# Patient Record
Sex: Male | Born: 1965 | Race: White | Hispanic: No | Marital: Married | Smoking: Former smoker
Health system: Southern US, Community
[De-identification: ages and names within clinical notes are randomized; demographics above are authoritative.]

## PROBLEM LIST (undated history)

## (undated) DIAGNOSIS — C801 Malignant (primary) neoplasm, unspecified: Secondary | ICD-10-CM

## (undated) HISTORY — PX: UPPER GASTROINTESTINAL ENDOSCOPY: SHX188

## (undated) HISTORY — DX: Malignant (primary) neoplasm, unspecified: C80.1

---

## 2005-12-16 ENCOUNTER — Ambulatory Visit: Payer: Self-pay | Admitting: Internal Medicine

## 2006-01-07 ENCOUNTER — Ambulatory Visit (HOSPITAL_COMMUNITY): Admission: RE | Admit: 2006-01-07 | Discharge: 2006-01-07 | Payer: Self-pay | Admitting: Internal Medicine

## 2006-01-07 ENCOUNTER — Ambulatory Visit: Payer: Self-pay | Admitting: Internal Medicine

## 2006-01-07 ENCOUNTER — Encounter (INDEPENDENT_AMBULATORY_CARE_PROVIDER_SITE_OTHER): Payer: Self-pay | Admitting: *Deleted

## 2006-04-07 ENCOUNTER — Ambulatory Visit: Payer: Self-pay | Admitting: Internal Medicine

## 2006-04-10 ENCOUNTER — Ambulatory Visit: Payer: Self-pay | Admitting: Internal Medicine

## 2006-04-21 ENCOUNTER — Ambulatory Visit: Payer: Self-pay | Admitting: Internal Medicine

## 2006-04-21 ENCOUNTER — Ambulatory Visit (HOSPITAL_COMMUNITY): Admission: RE | Admit: 2006-04-21 | Discharge: 2006-04-21 | Payer: Self-pay | Admitting: Internal Medicine

## 2006-04-21 ENCOUNTER — Encounter (INDEPENDENT_AMBULATORY_CARE_PROVIDER_SITE_OTHER): Payer: Self-pay | Admitting: Specialist

## 2008-05-03 ENCOUNTER — Emergency Department (HOSPITAL_COMMUNITY): Admission: EM | Admit: 2008-05-03 | Discharge: 2008-05-03 | Payer: Self-pay | Admitting: Emergency Medicine

## 2009-03-19 IMAGING — CT CT PELVIS W/O CM
1 of 2 series · 15 of 32 positions shown, 19 images · non-contrast
Comparison: None

CT ABDOMEN

CLINICAL DATA: Right flank pain, nausea, vomiting

CT ABDOMEN AND PELVIS WITHOUT CONTRAST
TECHNIQUE: Multidetector CT imaging of the abdomen and pelvis was
performed following the standard protocol without intravenous
contrast. Sagittal and coronal MPR images reconstructed from axial
data set.

[Series 2: stone 5.0 b40f · axial · 0.65mm/px · z∈[-514,-84]mm · 15 of 94 slices shown, 19 images]
[im 4/94  soft-tissue]
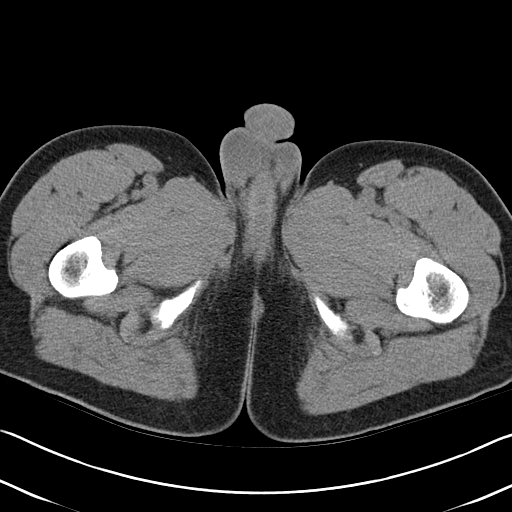
[im 4/94  bone]
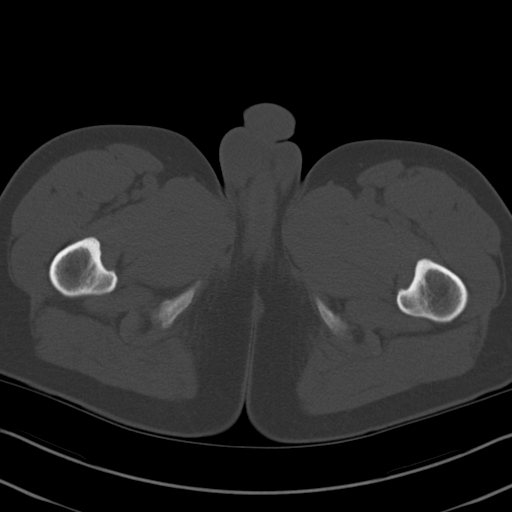
[im 12/94  soft-tissue]
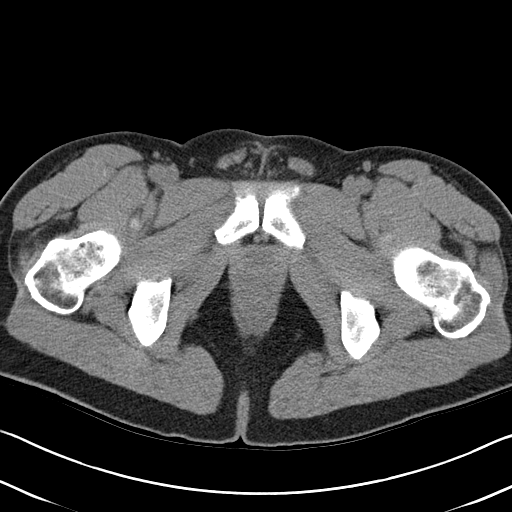
[im 19/94  soft-tissue]
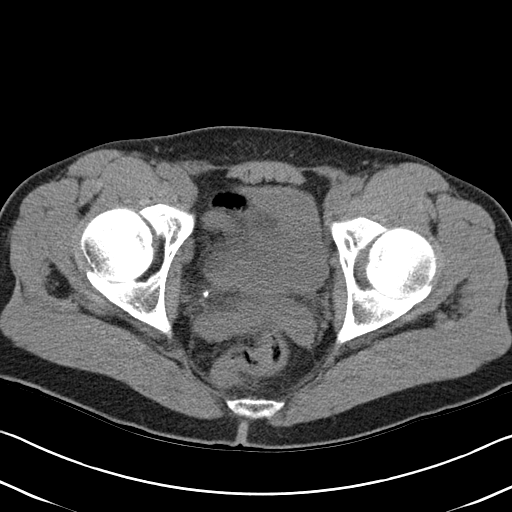
[im 27/94  soft-tissue]
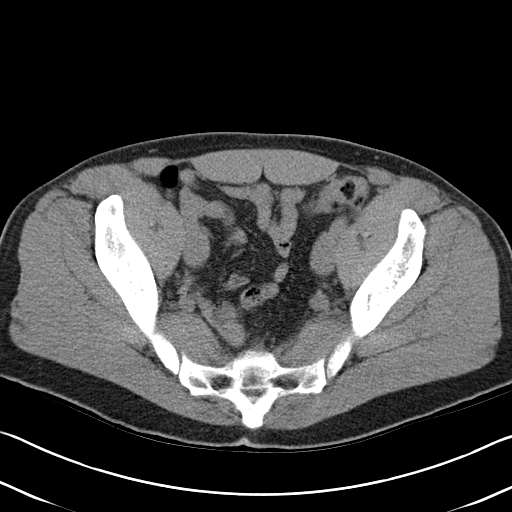
[im 34/94  soft-tissue]
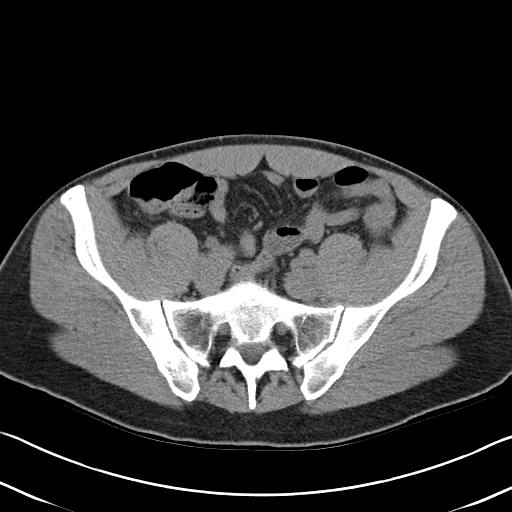
[im 41/94  soft-tissue]
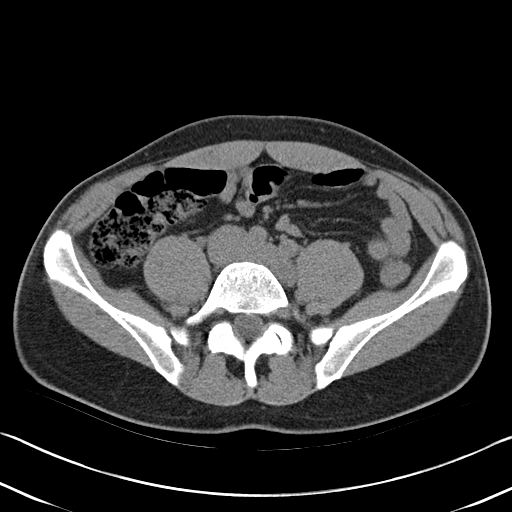
[im 49/94  soft-tissue]
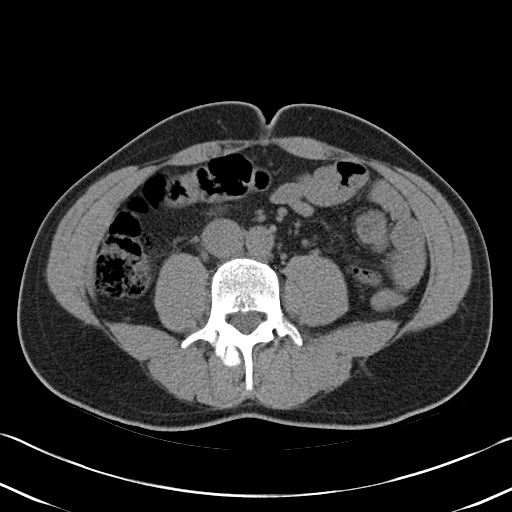
[im 53/94  soft-tissue]
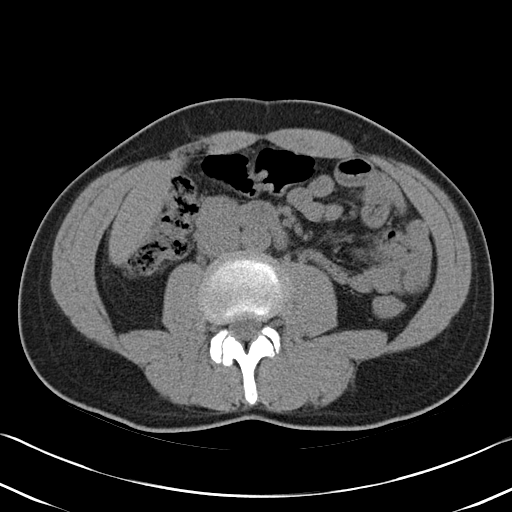
[im 60/94  soft-tissue]
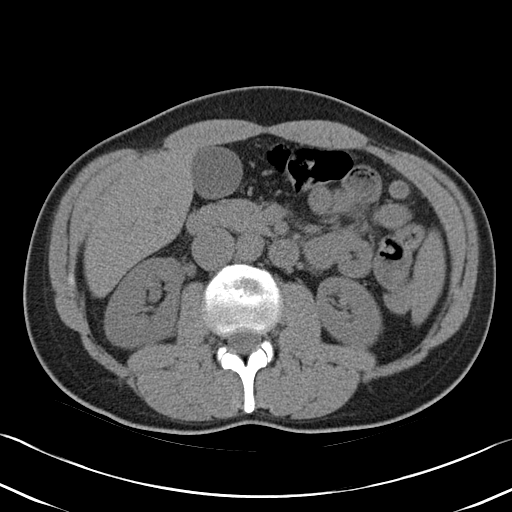
[im 60/94  bone]
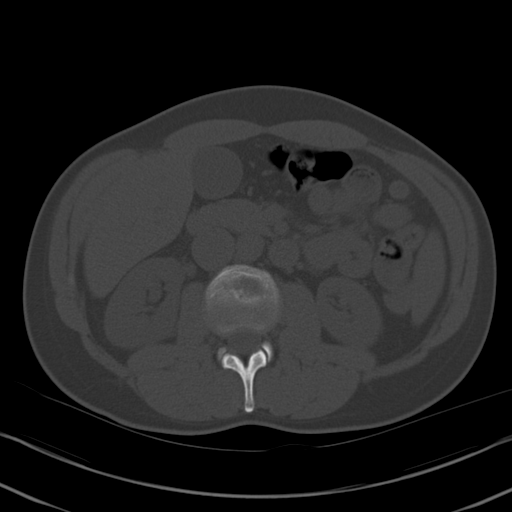
[im 67/94  soft-tissue]
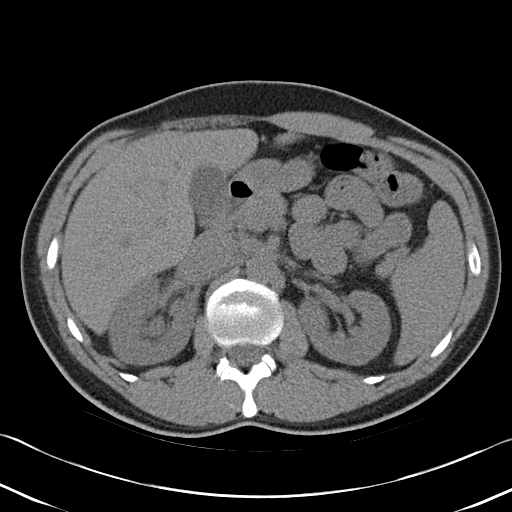
[im 75/94  soft-tissue]
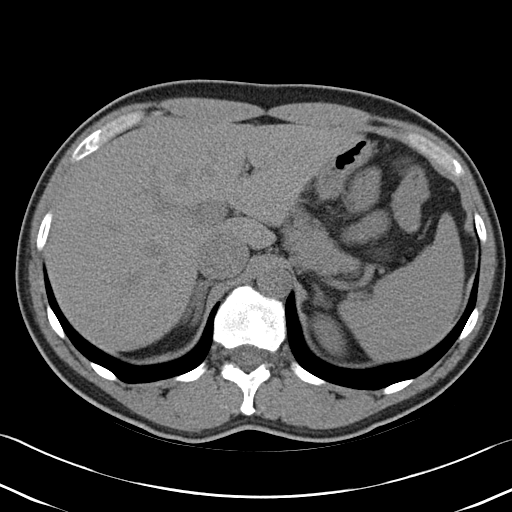
[im 79/94  lung]
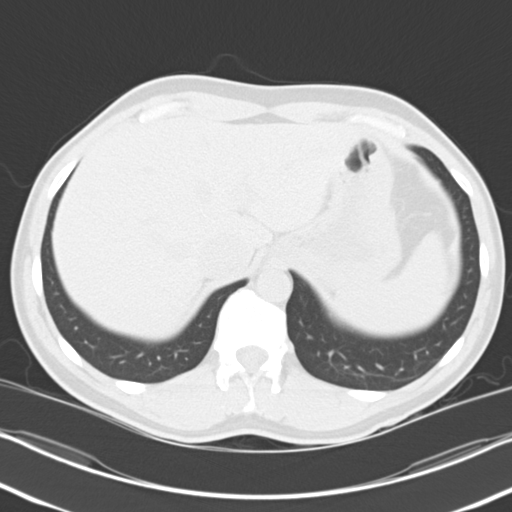
[im 82/94  soft-tissue]
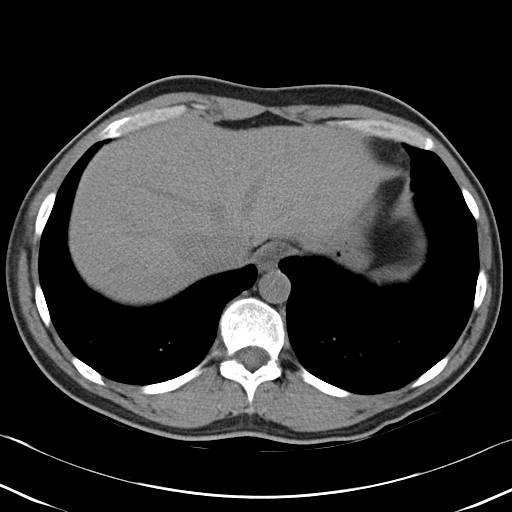
[im 82/94  lung]
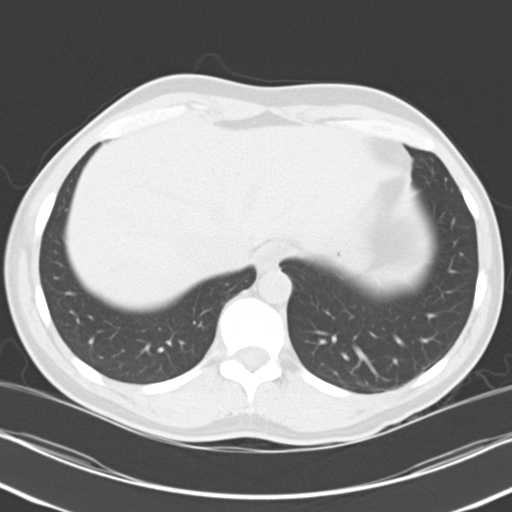
[im 86/94  lung]
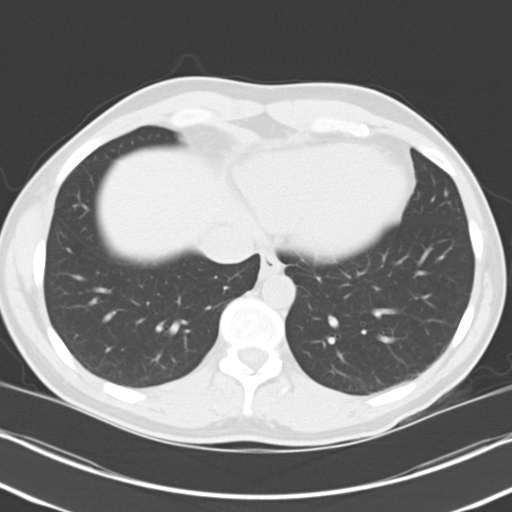
[im 90/94  soft-tissue]
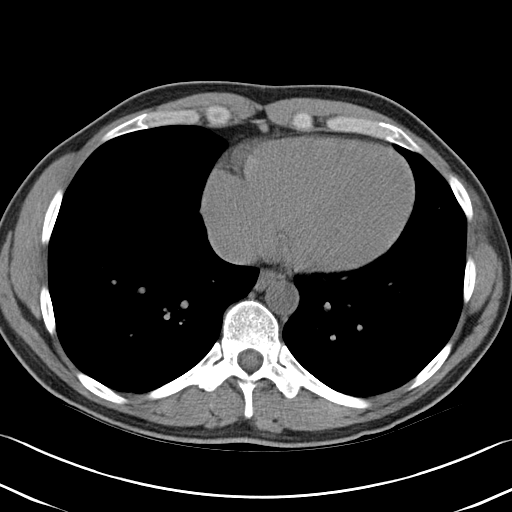
[im 90/94  lung]
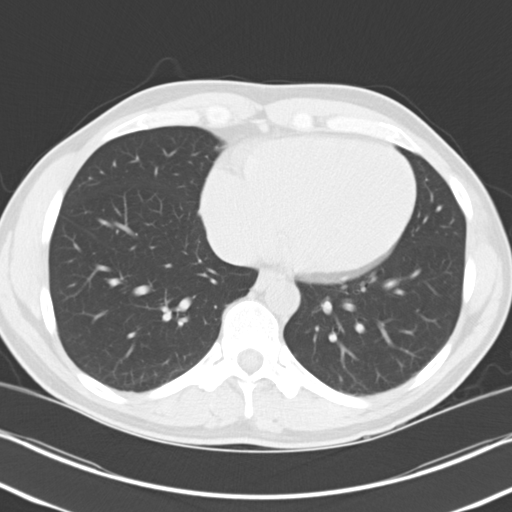

[15 of 32 positions shown; findings below may reference images not displayed]

FINDINGS: Lung bases clear.
Right hydronephrosis with dilatation of right ureter extending into
pelvis.
No calculi seen within either kidney.
Remaining solid organs and bowel loops in upper abdomen
unremarkable for exam lacking IV and oral contrast.
No mass, adenopathy, or free fluid.
IMPRESSION: Right hydronephrosis and hydroureter, see below.

CT PELVIS
FINDINGS: Right ureteral dilatation terminates at a 3 mm diameter calculus
image 76, just above ureterovesicle junction.
Minimal prostatic enlargement.
Bladder and distal left ureter normal appearance.
No pelvic mass, adenopathy, free fluid, or inflammatory process.
Pelvic bowel loops unremarkable.
Scattered Schmorl's nodes thoracolumbar spine with tiny bone island
in left sacrum.
IMPRESSION: 3 mm diameter distal right ureteral calculus above ureterovesicle
junction, causing right hydronephrosis and hydroureter.

## 2010-11-30 NOTE — H&P (Signed)
Joe Proctor, Joe Proctor                 ACCOUNT NO.:  [id number]   MEDICAL RECORD NO.:  [id number]           PATIENT TYPE:   LOCATION:                                 FACILITY:   PHYSICIAN:  R. Roetta Sessions, M.D. DATE OF BIRTH:  [birth year]   DATE OF ADMISSION:  DATE OF DISCHARGE:  LH                                HISTORY & PHYSICAL   REASON FOR CONSULTATION:  Iron deficiency.   HISTORY OF PRESENT ILLNESS:  Joe Proctor is a pleasant, 45 year old  Caucasian male employed with [employer] referred through the courtesy  of Dr. Lilyan Proctor to further evaluate a low ferritin and iron saturation.  Apparently earlier this year, he had some health maintenance labs done  through his employer and he was found to be mildly anemic on at least two  occasions. This was followed up with lab work through Dr. Fletcher Anon office.  Back on October 01, 2005, his H&H was normal at 13.3 and 40.8, MCV 81.4. Serum  iron was relatively low at 51. Ferritin was just below the lower limits of  normal at 20. Total iron binding capacity 361, saturation 15% with a serum  iron of 54 subsequently. His ferritin on October 14, 2005 was 30. He has been  Hemoccult negative x3 through Dr. Fletcher Anon office. He has not had any melena  or hematochezia. He has one bowel movement daily, no upper GI tract symptoms  such as odynophagia, dysphagia, or __________ reflux symptoms, nausea or  vomiting. No abdominal pain, no change in weight. He has no taken any  nonsteroidal's. Family history significant that one maternal uncle had colon  cancer and  he believes his father has had colonic polyps. Joe Proctor does  not eat red meat or green leafy vegetables very often. His diet is largely  chicken based.   PAST MEDICAL HISTORY:  Otherwise unremarkable for chronic illnesses.   PAST SURGICAL HISTORY:  None.   CURRENT MEDICATIONS:  None.   ALLERGIES:  No known drug allergies.   FAMILY HISTORY:  As outlined above.   SOCIAL  HISTORY:  The patient is married and has one child. He is employed  with the [employer] with [employer]. He stopped smoking at  age 44. No alcohol or illicit drugs. He is very active and jogs and bikes  for exercise.   REVIEW OF SYSTEMS:  No chest pain, no dyspnea on exertion. No fever or  chills otherwise as in history of present illness.   PHYSICAL EXAMINATION:  GENERAL:  Reveals a robust appearing 45 year old  gentleman resting comfortably.  VITAL SIGNS:  Weight 161, height 5 foot 11, temp 97.9, BP 118/80, pulse 60.  SKIN:  Warm and dry, no jaundice. No continuous stigmata of chronic liver  disease.  HEENT:  No scleral icterus, JVD is not prominent.  CHEST:  Lungs are clear to auscultation.  CARDIAC:  Regular rate and rhythm without murmur, gallop or rub.  ABDOMEN:  Nondistended, positive bowel sounds, soft, nontender, without  appreciable mass or organomegaly.  EXTREMITIES:  No edema.  RECTAL:  No external lesions. Good sphincter tones. No mass in the rectal  vault. Scant brown stools Hemoccult negative.   IMPRESSION:  Joe Proctor is a pleasant 45 year old gentleman with lab  work suggesting a tendency towards iron deficiency although he is not anemic  on more recent lab work.   Relative iron deficiency may be related to the lack of dietary intake of  iron. We certainly need to be concerned about the possibility of an early  lesion in his colon producing iron deficiency via a slow GI bleed although  the bleeding has not been documented as of this time. Also the possibility  of silent celiac disease needs to be considered as well.   RECOMMENDATIONS:  I recommended he go ahead and have a colonoscopy in the  couple of weeks. In the interim will draw a celiac panel to screen him for  celiac disease. The potential risks, benefits, and alternatives of this  approach have been reviewed, questions answered, he is agreeable. Will make  further recommendations in  the near future.   I would like to thank Dr. Lilyan Proctor for allowing me to see this nice  gentleman today.      Joe Proctor, M.D.  Electronically Signed     RMR/MEDQ  D:  12/16/2005  T:  12/16/2005  Job:  [id number]   cc:   Joe Picket A. Gerda Diss, MD  Fax: [phone]

## 2010-11-30 NOTE — Op Note (Signed)
Joe Proctor, Joe Proctor                 ACCOUNT NO.:  [id number]   MEDICAL RECORD NO.:  [id number]          PATIENT TYPE:  AMB   LOCATION:  DAY                           FACILITY:  [facility]   PHYSICIAN:  R. Roetta Sessions, M.D. DATE OF BIRTH:  1965-10-04   DATE OF PROCEDURE:  04/21/2006  DATE OF DISCHARGE:                                 OPERATIVE REPORT   PROCEDURE:  EGD with Given small bowel capsule deployment, followed by  subsequent upstream small bowel biopsy.   INDICATIONS FOR PROCEDURE:  The patient is a 45 year old gentleman with  profound iron deficiency anemia.  He recently underwent a colonoscopy which  was unrevealing. Celiac antibody panels negative.  He is undergoing EGD now,  possible survey of small bowel depending on EGD findings.  This approach has  been discussed with the patient previously. Potential risks, benefits, and  alternatives have reviewed, questions answered, and he is agreeable.  Please  see documentation in the medical record.   PROCEDURE NOTE:  O2 saturation, blood pressure, and pulse oximetry were  monitor the entire procedure.  Conscious sedation as Versed 4 mg IV and  Demerol 100 mg IV in divided doses   INSTRUMENT:  Olympus video chip system.   FINDINGS:  Examination of the tubular esophagus revealed no mucosal  abnormalities.  The EG junction was easily traversed.   Stomach:  The gastric cavity was empties and insufflated well with air.  Thorough examination of the gastric mucosa on retroflexion revealed the  proximal stomach and esophagogastric junction.  It demonstrated only a tiny  hiatal hernia.  Pylorus was patent and easily traversed.  Examination of the  bulb, second, and third portion revealed no abnormalities.   Therapeutic/diagnostic maneuvers performed:  I removed the scope, attached a  Given capsule deployment device, reintubated the esophagus with the capsule  loaded onto the device through the scope. It was easily advanced down to the  duodenum, where the Given capsule was deployed well into the second portion  of the duodenum.  Subsequently, I reintroduced scope and took some biopsies  of the second portion of duodenum, well above or upstream of the capsule.  The patient tolerated the procedure well and was reactivated.   ENDOSCOPIC IMPRESSION:  Normal esophagus.  Small tiny hiatal hernia,  otherwise normal stomach, D1-D3, status post small bowel capsule deployment  into the duodenum endoscopically, status post biopsies of the D2 and D3  upstream of capsule.   RECOMMENDATIONS:  To follow up on Given today and path.  Further  recommendations to follow.      Joe Proctor, M.D.  Electronically Signed     RMR/MEDQ  D:  04/21/2006  T:  04/21/2006  Job:  [id number]   cc:   Lorin Picket A. Gerda Diss, MD  Fax: [phone]

## 2010-11-30 NOTE — Op Note (Signed)
[name], Joe                 ACCOUNT NO.:  [id number]   MEDICAL RECORD NO.:  [id number]          PATIENT TYPE:  AMB   LOCATION:  DAY                           FACILITY:  [facility]   PHYSICIAN:  R. Roetta Sessions, M.D. DATE OF BIRTH:  24-Jun-1966   DATE OF PROCEDURE:  01/07/2006  DATE OF DISCHARGE:                                 OPERATIVE REPORT   PROCEDURE PERFORMED:  Colonoscopy with ileoscopy and biopsy.   INDICATIONS FOR PROCEDURE:  The patient is a 45 year old Caucasian male who  was found to have iron deficiency.  He is devoid of any GI tract symptoms.  Colonoscopy is now being done.  This approach has been discussed with the  patient at length.  Potential risks, benefits and alternatives have been  reviewed, questions have been answered, he is agreeable.  Please see  documentation in the medical records.  He does not have any first degree  relatives with colon cancer.  He does have a maternal uncle with the  disease.  His father may have had colonic polyps.  He is essentially a  vegetarian, but does not have an aversion to red meat, green leafy  vegetables otherwise.   PROCEDURE NOTE:  Oxygen saturations, blood pressure, pulse and respirations  were monitored throughout the entire procedure.   CONSCIOUS SEDATION:  Versed 5 mg IV, Demerol 75 mg IV in divided doses.   INSTRUMENT USED:  Olympus video chip system.   FINDINGS:  Digital rectal exam revealed no abnormalities.   ENDOSCOPIC FINDINGS:  Prep was adequate.   Rectum:  Examination of the rectal mucosa including retroflex view of the  anal verge revealed two diminutive polyps at 15 cm in from the anal verve,  minimal internal hemorrhoids, couple of anal papilla.  Otherwise rectal  mucosa appeared normal.  Colon:  Colonic mucosa was surveyed from the rectosigmoid junction to the  left, transverse and right colon to the area of the appendiceal orifice and  ileocecal valve and cecum.  These structures were well seen and  photographed  for the record.  The terminal ileum was intubated to 10 cm.  From this  level, the scope was cautiously, slowly withdrawn.  All previously mentioned  mucosal surfaces were again seen.  The patient had a few scattered sigmoid  diverticula.  The remainder of the colonic mucosa and terminal ileal mucosa  appeared normal.  The diminutive polyps in the rectum were cold  biopsied/removed.  The patient tolerated the procedure well, was reacted in  endoscopy.   IMPRESSION:  Minimal anal papilla.  Internal hemorrhoids.  Diminutive rectal  polyps, cold biopsied removed.  Remainder of rectal mucosa appeared normal.  Sigmoid diverticula.  Remainder of colonic mucosa and terminal ileal mucosa  appeared normal.   Findings were reassuring.  Iron deficiency may be related to the lack of  dietary iron intake.   RECOMMENDATIONS:  Continue multivitamin now with iron.  At least two  servings of red meat weekly with liberalization of green leafy vegetable  intake.  Follow-up on pathology.  Diverticulosis literature provided to [name].  We will see him back in eight weeks and recheck his CBC and iron  studies.      [name], M.D.  Electronically Signed     RMR/MEDQ  D:  01/07/2006  T:  01/07/2006  Job:  [id number]   cc:   Donna Bernard, M.D.  Fax: [phone]

## 2010-11-30 NOTE — Op Note (Signed)
Joe Proctor, Joe Proctor                 ACCOUNT NO.:  [id number]   MEDICAL RECORD NO.:  [id number]          PATIENT TYPE:  AMB   LOCATION:  DAY                           FACILITY:  [facility]   PHYSICIAN:  R. Roetta Sessions, M.D. DATE OF BIRTH:  Jul 13, 1966   DATE OF PROCEDURE:  04/21/2006  DATE OF DISCHARGE:  04/21/2006                                 OPERATIVE REPORT   INDICATIONS FOR PROCEDURE:  Patient is a 45 year old gentleman with relative  iron deficiency.  His hemoglobin is normal.  One out of three Hemoccult  cards came back positive for blood.  Clinically has not had any bleeding or  other GI symptoms.  Recent colonoscopy demonstrated a couple of small  hyperplastic polyps which were removed and some left-sided diverticula.  EGD  done 04/21/2006 demonstrated small hiatal hernia. otherwise upper GI tract  appeared normal.  The Given small bowel capsule was deployed into the small  bowel.   Images small bowel have been reviewed.  Small bowel mucosa appeared normal  although there was slight amount of blood-tinged mucus in the very proximal  duodenum (this is consistent with minimal bleeding from small bowel biopsy  which occurred just prior to capsule deployment).  The capsule reached the  cecum in 4 hours and 11 minutes.   These findings are reassuring.   We will review the small bowel histology when available, make further  recommendations in the very near future.      [name], M.D.  Electronically Signed     RMR/MEDQ  D:  04/22/2006  T:  04/23/2006  Job:  [id number]   cc:   Lorin Picket A. Gerda Diss, MD  Fax: [phone]

## 2010-11-30 NOTE — H&P (Signed)
[name], [name]                 ACCOUNT NO.:  [id number]   MEDICAL RECORD NO.:  [id number]          PATIENT TYPE:  AMB   LOCATION:  DAY                           FACILITY:  [facility]   PHYSICIAN:  R. Roetta Sessions, M.D. DATE OF BIRTH:  1966-06-04   DATE OF ADMISSION:  04/10/2006  DATE OF DISCHARGE:  LH                                HISTORY & PHYSICAL   CHIEF COMPLAINT:  Iron deficiency anemia, 1 out 3 Hemoccult cards positive,  a negative colonoscopy recently.  Joe Proctor is a pleasant 45 year old  Caucasian male essentially devoid of any GI symptoms who was found to be  iron deficient on a recent set of employee wellness labs.  He has been  followed by Dr. Lilyan Punt primarily.  His ferritin was found to be low at  20.  His hemoglobin and hematocrit have been in the low to normal range at  13.3 and 40.8 back in March of this year.  MCV was 84.1.  Iron studies  showed a serum iron of 54, iron sat of 15 with an IBC of 361.  Ferritin was  relatively low normal at 30.  I saw him for iron deficiency anemia back in  June.  He underwent a colonoscopy which revealed a minimal anal __________  internal hemorrhoids, diminutive rectal polyp which was removed and turned  out to be hyperplastic.  Otherwise, the rectum and colon and terminal ileum  appeared normal aside from sigmoid diverticula.  Celiac antibody screen came  back negative.  From April 01, 2006, his iron sat through our office was  low at 13, serum iron lower limit of normal at 46, TIBC 359, folate greater  than 20, ferritin 36.  He has returned 3 Hemoccult cards.  One has come back  positive.  Again, Joe Proctor has no GI symptoms.  No melena, rectal  bleeding, change in bowel habits, abdominal pain, or any upper GI symptoms  such as odynophagia, dysphagia, early satiety, reflux symptoms, nausea,  vomiting.  He is not taking any over-the-counter agents except for a  multivitamin with iron.  He has liberalized the intake of  red meats and  green leafy vegetables.  It is notable he has gained 7 pounds since he was  seen here in June.   PAST MEDICAL HISTORY:  Unremarkable for chronic illnesses.   PAST SURGICAL HISTORY:  None.   CURRENT MEDICATIONS:  Multivitamin with iron.   ALLERGIES:  No known drug allergies.   FAMILY HISTORY:  One maternal uncle had colon cancer.   SOCIAL HISTORY:  Patient is married.  He has 1 child.  He is employed with  [employer] with [employer].  He does not smoke and no  alcohol or illicit drugs.  He is very active; he jogs and bikes on a regular  basis.   REVIEW OF SYSTEMS:  As above.  No chest pain, dyspnea on exertion.  Weight  gain as outlined above.  No fever or chills.   PHYSICAL EXAMINATION:  GENERAL:  Reveals a pleasant 45 year old gentleman  resting comfortably.  VITAL SIGNS:  Weight 168, height 5 feet 11 inches, temp 97.1, BP 126/80,  pulse 56.  SKIN:  Warm and dry.  No jaundice.  No __________  stigmata of chronic liver  disease.  HEENT EXAM:  No scleral icterus.  JVD is not prominent.  CHEST:  Lungs are clear to auscultation.  CARDIAC EXAM:  Regular rate and rhythm without murmur, gallop, rub.   IMPRESSION:  Joe Proctor is a pleasant 45 year old gentleman who was  found to have relative iron deficiency state.  He is not anemic.  He has no  gastrointestinal symptoms.  Findings on recent colonoscopy reassuring, as  was a negative celiac antibody panel.  However, he is Hemoccult positive  which warrants further evaluation.  In the algorithm, I recommended [name] go ahead and have an EGD.  If that is unremarkable, we will go ahead  and deploy a Given's capsule to study his small bowel on the same day.  This  approach has been discussed with Joe Proctor at length; and its risks,  benefits, and alternatives have been reviewed.  Questions answered; he is  agreeable.  I will plan to perform the procedures in the near future.   Further  recommendations to follow.      [name], M.D.  Electronically Signed     RMR/MEDQ  D:  04/10/2006  T:  04/10/2006  Job:  [id number]   cc:   Lorin Picket A. Gerda Diss, MD  Fax: [phone]

## 2011-04-16 LAB — URINE MICROSCOPIC-ADD ON

## 2011-04-16 LAB — URINALYSIS, ROUTINE W REFLEX MICROSCOPIC
Ketones, ur: NEGATIVE
Leukocytes, UA: NEGATIVE
Nitrite: NEGATIVE
Specific Gravity, Urine: >1.03 — ABNORMAL HIGH
Urobilinogen, UA: 0.2

## 2013-02-04 DIAGNOSIS — Z0000 Encounter for general adult medical examination without abnormal findings: Secondary | ICD-10-CM

## 2013-05-31 ENCOUNTER — Telehealth: Payer: Self-pay | Admitting: Family Medicine

## 2013-05-31 DIAGNOSIS — R5381 Other malaise: Secondary | ICD-10-CM

## 2013-05-31 DIAGNOSIS — Z0000 Encounter for general adult medical examination without abnormal findings: Secondary | ICD-10-CM

## 2013-05-31 DIAGNOSIS — Z79899 Other long term (current) drug therapy: Secondary | ICD-10-CM

## 2013-05-31 DIAGNOSIS — R5383 Other fatigue: Secondary | ICD-10-CM

## 2013-05-31 NOTE — Telephone Encounter (Signed)
Lipid/liver/cbc/met 7

## 2013-05-31 NOTE — Telephone Encounter (Signed)
Blood work ordered in Epic. Patient was notified.  

## 2013-05-31 NOTE — Telephone Encounter (Signed)
Patient needs BW paperwork for physical °

## 2013-06-01 LAB — CBC WITH DIFFERENTIAL/PLATELET
Eosinophils Absolute: 0.2 10*3/uL (ref 0.0–0.7)
Eosinophils Relative: 3 % (ref 0–5)
HCT: 41.5 % (ref 39.0–52.0)
Hemoglobin: 13.9 g/dL (ref 13.0–17.0)
Lymphocytes Relative: 35 % (ref 12–46)
Lymphs Abs: 1.7 10*3/uL (ref 0.7–4.0)
MCH: 27 pg (ref 26.0–34.0)
MCV: 80.6 fL (ref 78.0–100.0)
Monocytes Absolute: 0.4 10*3/uL (ref 0.1–1.0)
Neutro Abs: 2.7 10*3/uL (ref 1.7–7.7)
Neutrophils Relative %: 55 % (ref 43–77)
Platelets: 195 10*3/uL (ref 150–400)
RBC: 5.15 MIL/uL (ref 4.22–5.81)
RDW: 13.4 % (ref 11.5–15.5)

## 2013-06-01 LAB — BASIC METABOLIC PANEL
BUN: 18 mg/dL (ref 6–23)
CO2: 31 mEq/L (ref 19–32)
Calcium: 9.6 mg/dL (ref 8.4–10.5)
Chloride: 104 mEq/L (ref 96–112)
Creat: 1.1 mg/dL (ref 0.50–1.35)
Glucose, Bld: 91 mg/dL (ref 70–99)
Potassium: 5.1 mEq/L (ref 3.5–5.3)
Sodium: 140 mEq/L (ref 135–145)

## 2013-06-01 LAB — HEPATIC FUNCTION PANEL
Albumin: 4.2 g/dL (ref 3.5–5.2)
Bilirubin, Direct: 0.2 mg/dL (ref 0.0–0.3)
Indirect Bilirubin: 0.5 mg/dL (ref 0.0–0.9)
Total Bilirubin: 0.7 mg/dL (ref 0.3–1.2)
Total Protein: 6.7 g/dL (ref 6.0–8.3)

## 2013-06-01 LAB — LIPID PANEL
Cholesterol: 136 mg/dL (ref 0–200)
HDL: 44 mg/dL (ref >39)
LDL Cholesterol: 71 mg/dL (ref 0–99)
Triglycerides: 107 mg/dL (ref <150)

## 2013-06-02 ENCOUNTER — Encounter: Payer: Self-pay | Admitting: Family Medicine

## 2013-06-12 ENCOUNTER — Encounter: Payer: Self-pay | Admitting: *Deleted

## 2013-06-14 ENCOUNTER — Ambulatory Visit (INDEPENDENT_AMBULATORY_CARE_PROVIDER_SITE_OTHER): Payer: 59 | Admitting: Family Medicine

## 2013-06-14 ENCOUNTER — Encounter: Payer: Self-pay | Admitting: Family Medicine

## 2013-06-14 VITALS — BP 120/70 | Ht 73.0 in | Wt 179.2 lb

## 2013-06-14 DIAGNOSIS — Z0000 Encounter for general adult medical examination without abnormal findings: Secondary | ICD-10-CM

## 2013-06-14 DIAGNOSIS — Z23 Encounter for immunization: Secondary | ICD-10-CM

## 2013-06-14 NOTE — Progress Notes (Signed)
   Subjective:    Patient ID: Joe Proctor, male    DOB: 1966-06-02, 47 y.o.   MRN: [id number]  HPI Patient arrives for an annual physical. Patient brought results of blood work and stress test that was run at work for discussion. The patient comes in today for a wellness visit.  A review of their health history was completed.  A review of medications was also completed. Any necessary refills were discussed. Sensible healthy diet was discussed. Importance of minimizing excessive salt and carbohydrates was also discussed. Safety was stressed including driving, activities at work and at home where applicable. Importance of regular physical activity for overall health was discussed. Preventative measures appropriate for age were discussed. Time was spent with the patient discussing any concerns they have about their well-being. Past medical history family history noncontributory no family history of any cancers.  Review of Systems  Constitutional: Negative for fever, activity change and appetite change.  HENT: Negative for congestion and rhinorrhea.   Eyes: Negative for discharge.  Respiratory: Negative for cough and wheezing.   Cardiovascular: Negative for chest pain.  Gastrointestinal: Negative for vomiting, abdominal pain and blood in stool.  Genitourinary: Negative for frequency and difficulty urinating.  Musculoskeletal: Negative for neck pain.  Skin: Negative for rash.  Allergic/Immunologic: Negative for environmental allergies and food allergies.  Neurological: Negative for weakness and headaches.  Psychiatric/Behavioral: Negative for agitation.       Objective:   Physical Exam  Constitutional: He appears well-developed and well-nourished.  HENT:  Head: Normocephalic and atraumatic.  Right Ear: External ear normal.  Left Ear: External ear normal.  Nose: Nose normal.  Mouth/Throat: Oropharynx is clear and moist.  Eyes: EOM are normal. Pupils are equal, round, and  reactive to light.  Neck: Normal range of motion. Neck supple. No thyromegaly present.  Cardiovascular: Normal rate, regular rhythm and normal heart sounds.   No murmur heard. Pulmonary/Chest: Effort normal and breath sounds normal. No respiratory distress. He has no wheezes.  Abdominal: Soft. Bowel sounds are normal. He exhibits no distension and no mass. There is no tenderness.  Genitourinary: Penis normal.  Musculoskeletal: Normal range of motion. He exhibits no edema.  Lymphadenopathy:    He has no cervical adenopathy.  Neurological: He is alert. He exhibits normal muscle tone.  Skin: Skin is warm and dry. No erythema.  Psychiatric: He has a normal mood and affect. His behavior is normal. Judgment normal.          Assessment & Plan:  #1 wellness- overall he's doing good watch diet stay physically active. Safety was discussed. Lab work was reviewed looks very good. Followup again in one years time. He will need colonoscopy at age 37 prostate exam and PSA at age 22.

## 2014-05-24 ENCOUNTER — Other Ambulatory Visit: Payer: Self-pay | Admitting: Dermatology

## 2014-10-19 ENCOUNTER — Ambulatory Visit (INDEPENDENT_AMBULATORY_CARE_PROVIDER_SITE_OTHER): Payer: 59 | Admitting: Family Medicine

## 2014-10-19 ENCOUNTER — Encounter: Payer: Self-pay | Admitting: Family Medicine

## 2014-10-19 VITALS — BP 122/74 | Ht 73.0 in | Wt 175.0 lb

## 2014-10-19 DIAGNOSIS — B029 Zoster without complications: Secondary | ICD-10-CM | POA: Diagnosis not present

## 2014-10-19 MED ORDER — VALACYCLOVIR HCL 1 G PO TABS: 1000.0000 mg | ORAL_TABLET | Freq: Three times a day (TID) | ORAL | Status: DC

## 2014-10-19 NOTE — Progress Notes (Signed)
   Subjective:    Patient ID: Joe Proctor, male    DOB: [birth year], 49 y.o.   MRN: [id number]  Rash This is a new problem. Episode onset: 7 days ago. Pain location: right side. Treatments tried: benadryl cream.   Patient not sure if he had chickenpox as a child.  Also has a history of thoracic spinal pain and neuropathy which proceeded this   Not partic painful but sharp aching at times.   Alert vitals stable no acute distress lungs clear. Heart regular in rhythm. Started off as a rash , some discomfort skin classic cluster rash noted.  Review of Systems  Skin: Positive for rash.       Objective:   Physical Exam  See above      Assessment & Plan:  Impression shingles discussed at length plan Valtrex 3 times a day 7 days. Symptom care discussed questions answered WSL

## 2015-09-14 ENCOUNTER — Encounter: Payer: Self-pay | Admitting: Family Medicine

## 2015-09-14 ENCOUNTER — Ambulatory Visit (INDEPENDENT_AMBULATORY_CARE_PROVIDER_SITE_OTHER): Payer: 59 | Admitting: Family Medicine

## 2015-09-14 VITALS — BP 120/80 | Temp 98.4°F | Ht 73.0 in | Wt 186.5 lb

## 2015-09-14 DIAGNOSIS — J310 Chronic rhinitis: Secondary | ICD-10-CM

## 2015-09-14 DIAGNOSIS — J329 Chronic sinusitis, unspecified: Secondary | ICD-10-CM | POA: Diagnosis not present

## 2015-09-14 DIAGNOSIS — J209 Acute bronchitis, unspecified: Secondary | ICD-10-CM | POA: Diagnosis not present

## 2015-09-14 MED ORDER — AZITHROMYCIN 250 MG PO TABS: ORAL_TABLET | ORAL | Status: DC

## 2015-09-14 NOTE — Progress Notes (Signed)
   Subjective:    Patient ID: Joe Proctor, male    DOB: [birth year], 50 y.o.   MRN: [id number]  Cough This is a new problem. The current episode started in the past 7 days. The problem occurs every few minutes. The cough is non-productive. Associated symptoms include headaches and rhinorrhea. He has tried OTC cough suppressant (Mucinex DM) for the symptoms. The treatment provided mild relief.  cough deeper in chest  Some cong in the nasal passages     non smoker  Got a flu shot   Pos illness within the family  Non productive  Hoarse ful Patient states no other concerns this visit.  Review of Systems  HENT: Positive for rhinorrhea.   Respiratory: Positive for cough.   Neurological: Positive for headaches.       Objective:   Physical Exam  Alert, mild malaise. Hydration good Vitals stable. frontal/ maxillary tenderness evident positive nasal congestion. pharynx normal neck supple  lungs clear/no crackles or wheezes. heart regular in rhythm bronchial cough during exam       Assessment & Plan:  Impression post flu bronchitis/ rhinosinusitis likely post viral, discussed with patient. plan antibiotics prescribed. Questions answered. Symptomatic care discussed. warning signs discussed. WSL

## 2017-03-31 ENCOUNTER — Telehealth: Payer: Self-pay | Admitting: Family Medicine

## 2017-03-31 DIAGNOSIS — Z0000 Encounter for general adult medical examination without abnormal findings: Secondary | ICD-10-CM

## 2017-03-31 NOTE — Telephone Encounter (Signed)
Patient has physical next week with Dr. Nicki Reaper.  Would like orders put in to have bloodwork done at the hospital.  Please call patient when orders are in. Ok to leave a message.

## 2017-03-31 NOTE — Telephone Encounter (Signed)
Lipid, liver, metabolic 7, CBC, PSA, hepatitis C antibody for screening purposes

## 2017-03-31 NOTE — Telephone Encounter (Signed)
Spoke with patient and informed him per [place] have been ordered for upcoming appointment. Patient verbalized understanding.

## 2017-04-01 ENCOUNTER — Other Ambulatory Visit: Payer: Self-pay | Admitting: Family Medicine

## 2017-04-01 DIAGNOSIS — Z0000 Encounter for general adult medical examination without abnormal findings: Secondary | ICD-10-CM | POA: Diagnosis not present

## 2017-04-02 LAB — BASIC METABOLIC PANEL
BUN/Creatinine Ratio: 15 (ref 9–20)
BUN: 18 mg/dL (ref 6–24)
CO2: 26 mmol/L (ref 20–29)
CREATININE: 1.24 mg/dL (ref 0.76–1.27)
Calcium: 9.7 mg/dL (ref 8.7–10.2)
Chloride: 100 mmol/L (ref 96–106)
GFR calc Af Amer: 77 mL/min/{1.73_m2} (ref >59)
GFR, EST NON AFRICAN AMERICAN: 66 mL/min/{1.73_m2} (ref >59)
Glucose: 93 mg/dL (ref 65–99)
Potassium: 5.2 mmol/L (ref 3.5–5.2)
SODIUM: 140 mmol/L (ref 134–144)

## 2017-04-02 LAB — CBC/DIFF AMBIGUOUS DEFAULT
Basophils Absolute: 0 10*3/uL (ref 0.0–0.2)
Basos: 0 %
EOS (ABSOLUTE): 0.1 10*3/uL (ref 0.0–0.4)
EOS: 2 %
HEMATOCRIT: 42.6 % (ref 37.5–51.0)
HEMOGLOBIN: 14.4 g/dL (ref 13.0–17.7)
IMMATURE GRANS (ABS): 0 10*3/uL (ref 0.0–0.1)
Immature Granulocytes: 0 %
LYMPHS ABS: 1.8 10*3/uL (ref 0.7–3.1)
Lymphs: 35 %
MCH: 27.1 pg (ref 26.6–33.0)
MCHC: 33.8 g/dL (ref 31.5–35.7)
MCV: 80 fL (ref 79–97)
MONOCYTES: 7 %
Monocytes Absolute: 0.3 10*3/uL (ref 0.1–0.9)
NEUTROS ABS: 2.9 10*3/uL (ref 1.4–7.0)
Neutrophils: 56 %
Platelets: 189 10*3/uL (ref 150–379)
RBC: 5.31 x10E6/uL (ref 4.14–5.80)
RDW: 13.3 % (ref 12.3–15.4)
WBC: 5.1 10*3/uL (ref 3.4–10.8)

## 2017-04-02 LAB — HEPATIC FUNCTION PANEL
ALT: 19 IU/L (ref 0–44)
AST: 18 IU/L (ref 0–40)
Albumin: 4.8 g/dL (ref 3.5–5.5)
Alkaline Phosphatase: 93 IU/L (ref 39–117)
Bilirubin Total: 0.8 mg/dL (ref 0.0–1.2)
Bilirubin, Direct: 0.2 mg/dL (ref 0.00–0.40)
TOTAL PROTEIN: 7.6 g/dL (ref 6.0–8.5)

## 2017-04-02 LAB — AMBIG ABBREV LP DEFAULT

## 2017-04-02 LAB — LIPID PANEL W/O CHOL/HDL RATIO
Cholesterol, Total: 160 mg/dL (ref 100–199)
HDL: 50 mg/dL (ref >39)
LDL CALC: 91 mg/dL (ref 0–99)
TRIGLYCERIDES: 95 mg/dL (ref 0–149)
VLDL CHOLESTEROL CAL: 19 mg/dL (ref 5–40)

## 2017-04-02 LAB — AMBIG ABBREV HFP7 DEFAULT

## 2017-04-02 LAB — HEPATITIS C ANTIBODY

## 2017-04-02 LAB — PSA: PROSTATE SPECIFIC AG, SERUM: 0.9 ng/mL (ref 0.0–4.0)

## 2017-04-02 LAB — AMBIG ABBREV BMP8 DEFAULT

## 2017-04-07 ENCOUNTER — Encounter: Payer: Self-pay | Admitting: Family Medicine

## 2017-04-07 ENCOUNTER — Ambulatory Visit (INDEPENDENT_AMBULATORY_CARE_PROVIDER_SITE_OTHER): Payer: 59 | Admitting: Family Medicine

## 2017-04-07 VITALS — BP 110/70 | Ht 73.0 in | Wt 179.5 lb

## 2017-04-07 DIAGNOSIS — Z0000 Encounter for general adult medical examination without abnormal findings: Secondary | ICD-10-CM

## 2017-04-07 DIAGNOSIS — Z1211 Encounter for screening for malignant neoplasm of colon: Secondary | ICD-10-CM | POA: Diagnosis not present

## 2017-04-07 NOTE — Progress Notes (Signed)
   Subjective:    Patient ID: Joe Proctor, male    DOB: 1966-04-20, 51 y.o.   MRN: [id number]  HPI The patient comes in today for a wellness visit.    A review of their health history was completed.  A review of medications was also completed.  Any needed refills; None   Eating habits: Patient states eating habits are good. Tries to eat healthy.   Falls/  MVA accidents in past few months:  None   Regular exercise: Patient states tries to run 2x weekly and cross fit class 2x-3x per week.  Specialist pt sees on regular basis: None   Preventative health issues were discussed.  Diet doing good exercising on a regular basis is safe and what he does denies any injuries area states her moods overall doing good denies being depressed Additional concerns: Patient states no concerns this visit.    Review of Systems  Constitutional: Negative for activity change, appetite change and fever.  HENT: Negative for congestion and rhinorrhea.   Eyes: Negative for discharge.  Respiratory: Negative for cough and wheezing.   Cardiovascular: Negative for chest pain.  Gastrointestinal: Negative for abdominal pain, blood in stool and vomiting.  Genitourinary: Negative for difficulty urinating and frequency.  Musculoskeletal: Negative for neck pain.  Skin: Negative for rash.  Allergic/Immunologic: Negative for environmental allergies and food allergies.  Neurological: Negative for weakness and headaches.  Psychiatric/Behavioral: Negative for agitation.       Objective:   Physical Exam  Constitutional: He appears well-developed and well-nourished.  HENT:  Head: Normocephalic and atraumatic.  Right Ear: External ear normal.  Left Ear: External ear normal.  Nose: Nose normal.  Mouth/Throat: Oropharynx is clear and moist.  Eyes: Pupils are equal, round, and reactive to light. EOM are normal.  Neck: Normal range of motion. Neck supple. No thyromegaly present.  Cardiovascular: Normal rate,  regular rhythm and normal heart sounds.   No murmur heard. Pulmonary/Chest: Effort normal and breath sounds normal. No respiratory distress. He has no wheezes.  Abdominal: Soft. Bowel sounds are normal. He exhibits no distension and no mass. There is no tenderness.  Genitourinary: Penis normal.  Musculoskeletal: Normal range of motion. He exhibits no edema.  Lymphadenopathy:    He has no cervical adenopathy.  Neurological: He is alert. He exhibits normal muscle tone.  Skin: Skin is warm and dry. No erythema.  Psychiatric: He has a normal mood and affect. His behavior is normal. Judgment normal.          Assessment & Plan:  Adult wellness-complete.wellness physical was conducted today. Importance of diet and exercise were discussed in detail. In addition to this a discussion regarding safety was also covered. We also reviewed over immunizations and gave recommendations regarding current immunization needed for age. In addition to this additional areas were also touched on including: Preventative health exams needed: Colonoscopy Referral for colonoscopy  Patient was advised yearly wellness exam

## 2017-04-09 DIAGNOSIS — L821 Other seborrheic keratosis: Secondary | ICD-10-CM | POA: Diagnosis not present

## 2017-04-09 DIAGNOSIS — D229 Melanocytic nevi, unspecified: Secondary | ICD-10-CM | POA: Diagnosis not present

## 2018-05-20 ENCOUNTER — Other Ambulatory Visit: Payer: Self-pay | Admitting: Dermatology

## 2018-05-20 DIAGNOSIS — L570 Actinic keratosis: Secondary | ICD-10-CM | POA: Diagnosis not present

## 2018-05-20 DIAGNOSIS — L821 Other seborrheic keratosis: Secondary | ICD-10-CM | POA: Diagnosis not present

## 2018-05-20 DIAGNOSIS — C44319 Basal cell carcinoma of skin of other parts of face: Secondary | ICD-10-CM | POA: Diagnosis not present

## 2018-05-20 DIAGNOSIS — D229 Melanocytic nevi, unspecified: Secondary | ICD-10-CM | POA: Diagnosis not present

## 2018-07-20 DIAGNOSIS — C44319 Basal cell carcinoma of skin of other parts of face: Secondary | ICD-10-CM | POA: Diagnosis not present

## 2019-04-14 ENCOUNTER — Other Ambulatory Visit: Payer: Self-pay

## 2019-04-14 ENCOUNTER — Encounter: Payer: Self-pay | Admitting: Family Medicine

## 2019-04-14 ENCOUNTER — Ambulatory Visit (INDEPENDENT_AMBULATORY_CARE_PROVIDER_SITE_OTHER): Payer: 59 | Admitting: Family Medicine

## 2019-04-14 VITALS — BP 128/76 | Temp 97.4°F | Ht 70.5 in | Wt 180.6 lb

## 2019-04-14 DIAGNOSIS — Z23 Encounter for immunization: Secondary | ICD-10-CM

## 2019-04-14 DIAGNOSIS — Z0000 Encounter for general adult medical examination without abnormal findings: Secondary | ICD-10-CM | POA: Diagnosis not present

## 2019-04-14 DIAGNOSIS — Z1211 Encounter for screening for malignant neoplasm of colon: Secondary | ICD-10-CM | POA: Diagnosis not present

## 2019-04-14 DIAGNOSIS — Z125 Encounter for screening for malignant neoplasm of prostate: Secondary | ICD-10-CM

## 2019-04-14 NOTE — Progress Notes (Signed)
Subjective:    Patient ID: Joe Proctor, male    DOB: Jun 25, 1966, 53 y.o.   MRN: [id number]  HPI The patient comes in today for a wellness visit.    A review of their health history was completed.  A review of medications was also completed.  Any needed refills; not taking any prescription meds  Eating habits: health conscious  Falls/  MVA accidents in past few months: none  Regular exercise: runs 5-9 miles weekly, weight lifting 2-3 days weekly, was doing cross fit prior to covid.  Specialist pt sees on regular basis: Does not see any specialist currently  Preventative health issues were discussed.   Additional concerns: No additional concerns  Patient does not drink or smoke he is fairly active he exercises on a regular basis.  Mental health doing well denies being depressed  Review of Systems  Constitutional: Negative for activity change, appetite change and fever.  HENT: Negative for congestion and rhinorrhea.   Eyes: Negative for discharge.  Respiratory: Negative for cough and wheezing.   Cardiovascular: Negative for chest pain.  Gastrointestinal: Negative for abdominal pain, blood in stool and vomiting.  Genitourinary: Negative for difficulty urinating and frequency.  Musculoskeletal: Negative for neck pain.  Skin: Negative for rash.  Allergic/Immunologic: Negative for environmental allergies and food allergies.  Neurological: Negative for weakness and headaches.  Psychiatric/Behavioral: Negative for agitation.       Objective:   Physical Exam Constitutional:      Appearance: He is well-developed.  HENT:     Head: Normocephalic and atraumatic.     Right Ear: External ear normal.     Left Ear: External ear normal.     Nose: Nose normal.  Eyes:     Pupils: Pupils are equal, round, and reactive to light.  Neck:     Musculoskeletal: Normal range of motion and neck supple.     Thyroid: No thyromegaly.  Cardiovascular:     Rate and Rhythm: Normal rate  and regular rhythm.     Heart sounds: Normal heart sounds. No murmur.  Pulmonary:     Effort: Pulmonary effort is normal. No respiratory distress.     Breath sounds: Normal breath sounds. No wheezing.  Abdominal:     General: Bowel sounds are normal. There is no distension.     Palpations: Abdomen is soft. There is no mass.     Tenderness: There is no abdominal tenderness.  Genitourinary:    Penis: Normal.   Musculoskeletal: Normal range of motion.  Lymphadenopathy:     Cervical: No cervical adenopathy.  Skin:    General: Skin is warm and dry.     Findings: No erythema.  Neurological:     Mental Status: He is alert.     Motor: No abnormal muscle tone.  Psychiatric:        Behavior: Behavior normal.        Judgment: Judgment normal.   GU normal prostate exam normal    Safety was discussed in detail    Assessment & Plan:  Adult wellness-complete.wellness physical was conducted today. Importance of diet and exercise were discussed in detail.  In addition to this a discussion regarding safety was also covered. We also reviewed over immunizations and gave recommendations regarding current immunization needed for age.  In addition to this additional areas were also touched on including: Preventative health exams needed:  Colonoscopy colonoscopy indicated.  Referral for colonoscopy patient states he will call back with who he would like  to see  Patient will go ahead and do lab work  Patient was advised yearly wellness exam

## 2019-04-30 LAB — HEPATIC FUNCTION PANEL
ALT: 34 [IU]/L (ref 0–44)
AST: 24 [IU]/L (ref 0–40)
Albumin: 4.7 g/dL (ref 3.8–4.9)
Alkaline Phosphatase: 89 [IU]/L (ref 39–117)
Bilirubin Total: 1 mg/dL (ref 0.0–1.2)
Bilirubin, Direct: 0.2 mg/dL (ref 0.00–0.40)
Total Protein: 7.3 g/dL (ref 6.0–8.5)

## 2019-04-30 LAB — BASIC METABOLIC PANEL
BUN/Creatinine Ratio: 12 (ref 9–20)
BUN: 16 mg/dL (ref 6–24)
CO2: 26 mmol/L (ref 20–29)
Calcium: 9.9 mg/dL (ref 8.7–10.2)
Chloride: 100 mmol/L (ref 96–106)
Creatinine, Ser: 1.29 mg/dL — ABNORMAL HIGH (ref 0.76–1.27)
GFR calc Af Amer: 72 mL/min/{1.73_m2} (ref >59)
GFR calc non Af Amer: 62 mL/min/{1.73_m2} (ref >59)
Glucose: 97 mg/dL (ref 65–99)
Potassium: 4.9 mmol/L (ref 3.5–5.2)
Sodium: 140 mmol/L (ref 134–144)

## 2019-04-30 LAB — PSA: Prostate Specific Ag, Serum: 0.6 ng/mL (ref 0.0–4.0)

## 2019-04-30 LAB — LIPID PANEL
Chol/HDL Ratio: 3.7 ratio (ref 0.0–5.0)
Cholesterol, Total: 172 mg/dL (ref 100–199)
HDL: 47 mg/dL (ref >39)
LDL Chol Calc (NIH): 95 mg/dL (ref 0–99)
Triglycerides: 175 mg/dL — ABNORMAL HIGH (ref 0–149)
VLDL Cholesterol Cal: 30 mg/dL (ref 5–40)

## 2019-05-02 ENCOUNTER — Encounter: Payer: Self-pay | Admitting: Family Medicine

## 2019-05-19 ENCOUNTER — Encounter: Payer: Self-pay | Admitting: Internal Medicine

## 2019-06-22 ENCOUNTER — Encounter: Payer: Self-pay | Admitting: Internal Medicine

## 2019-06-22 ENCOUNTER — Ambulatory Visit (AMBULATORY_SURGERY_CENTER): Payer: 59

## 2019-06-22 ENCOUNTER — Other Ambulatory Visit: Payer: Self-pay

## 2019-06-22 VITALS — Temp 96.8°F | Ht 70.5 in | Wt 185.8 lb

## 2019-06-22 DIAGNOSIS — Z1159 Encounter for screening for other viral diseases: Secondary | ICD-10-CM

## 2019-06-22 DIAGNOSIS — Z1211 Encounter for screening for malignant neoplasm of colon: Secondary | ICD-10-CM

## 2019-06-22 MED ORDER — NA SULFATE-K SULFATE-MG SULF 17.5-3.13-1.6 GM/177ML PO SOLN: 1.0000 | Freq: Once | ORAL | 0 refills | Status: AC

## 2019-06-22 NOTE — Progress Notes (Signed)

## 2019-06-30 ENCOUNTER — Other Ambulatory Visit (HOSPITAL_COMMUNITY)
Admission: RE | Admit: 2019-06-30 | Discharge: 2019-06-30 | Disposition: A | Discharge status: Home / Self Care | Payer: 59 | Source: Ambulatory Visit | Attending: Internal Medicine | Admitting: Internal Medicine

## 2019-06-30 DIAGNOSIS — Z01812 Encounter for preprocedural laboratory examination: Secondary | ICD-10-CM | POA: Insufficient documentation

## 2019-06-30 DIAGNOSIS — Z20828 Contact with and (suspected) exposure to other viral communicable diseases: Secondary | ICD-10-CM | POA: Diagnosis not present

## 2019-06-30 LAB — SARS CORONAVIRUS 2 (TAT 6-24 HRS): SARS Coronavirus 2: NEGATIVE

## 2019-07-05 ENCOUNTER — Ambulatory Visit (AMBULATORY_SURGERY_CENTER): Payer: 59 | Admitting: Internal Medicine

## 2019-07-05 ENCOUNTER — Other Ambulatory Visit: Payer: Self-pay

## 2019-07-05 ENCOUNTER — Encounter: Payer: Self-pay | Admitting: Internal Medicine

## 2019-07-05 VITALS — BP 125/71 | HR 49 | Temp 98.7°F | Resp 17 | Ht 70.5 in | Wt 185.0 lb

## 2019-07-05 DIAGNOSIS — D122 Benign neoplasm of ascending colon: Secondary | ICD-10-CM

## 2019-07-05 DIAGNOSIS — D123 Benign neoplasm of transverse colon: Secondary | ICD-10-CM

## 2019-07-05 DIAGNOSIS — K635 Polyp of colon: Secondary | ICD-10-CM | POA: Diagnosis not present

## 2019-07-05 DIAGNOSIS — Z1211 Encounter for screening for malignant neoplasm of colon: Secondary | ICD-10-CM

## 2019-07-05 DIAGNOSIS — D125 Benign neoplasm of sigmoid colon: Secondary | ICD-10-CM

## 2019-07-05 MED ORDER — SODIUM CHLORIDE 0.9 % IV SOLN: 500.0000 mL | Freq: Once | INTRAVENOUS | Status: DC

## 2019-07-05 NOTE — Progress Notes (Signed)
Vitals-CW Temp-JB  Pt's states no medical or surgical changes since previsit or office visit. 

## 2019-07-05 NOTE — Progress Notes (Signed)
To PACU, VSS. Report to Rn.tb 

## 2019-07-05 NOTE — Progress Notes (Signed)
No problems noted in the recovery room. maw 

## 2019-07-05 NOTE — Patient Instructions (Signed)
YOU HAD AN ENDOSCOPIC PROCEDURE TODAY AT [facility]:   Refer to the procedure report that was given to you for any specific questions about what was found during the examination.  If the procedure report does not answer your questions, please call your gastroenterologist to clarify.  If you requested that your care partner not be given the details of your procedure findings, then the procedure report has been included in a sealed envelope for you to review at your convenience later.  YOU SHOULD EXPECT: Some feelings of bloating in the abdomen. Passage of more gas than usual.  Walking can help get rid of the air that was put into your GI tract during the procedure and reduce the bloating. If you had a lower endoscopy (such as a colonoscopy or flexible sigmoidoscopy) you may notice spotting of blood in your stool or on the toilet paper. If you underwent a bowel prep for your procedure, you may not have a normal bowel movement for a few days.  Please Note:  You might notice some irritation and congestion in your nose or some drainage.  This is from the oxygen used during your procedure.  There is no need for concern and it should clear up in a day or so.  SYMPTOMS TO REPORT IMMEDIATELY:   Following lower endoscopy (colonoscopy or flexible sigmoidoscopy):  Excessive amounts of blood in the stool  Significant tenderness or worsening of abdominal pains  Swelling of the abdomen that is new, acute  Fever of 100F or higher    For urgent or emergent issues, a gastroenterologist can be reached at any hour by calling [phone].   DIET:  We do recommend a small meal at first, but then you may proceed to your regular diet.  Drink plenty of fluids but you should avoid alcoholic beverages for 24 hours.  ACTIVITY:  You should plan to take it easy for the rest of today and you should NOT DRIVE or use heavy machinery until tomorrow (because of the sedation medicines used during the test).     FOLLOW UP: Our staff will call the number listed on your records 48-72 hours following your procedure to check on you and address any questions or concerns that you may have regarding the information given to you following your procedure. If we do not reach you, we will leave a message.  We will attempt to reach you two times.  During this call, we will ask if you have developed any symptoms of COVID 19. If you develop any symptoms (ie: fever, flu-like symptoms, shortness of breath, cough etc.) before then, please call [phone].  If you test positive for Covid 19 in the 2 weeks post procedure, please call and report this information to [place].    If any biopsies were taken you will be contacted by phone or by letter within the next 1-3 weeks.  Please call us at [phone] if you have not heard about the biopsies in 3 weeks.    SIGNATURES/CONFIDENTIALITY: You and/or your care partner have signed paperwork which will be entered into your electronic medical record.  These signatures attest to the fact that that the information above on your After Visit Summary has been reviewed and is understood.  Full responsibility of the confidentiality of this discharge information lies with you and/or your care-partner.    Handouts were given to your care partner on polyps, diverticulosis, and  Hemorrhoids. You may resume your current medications today. Await biopsy results.  Please call if any questions or concerns.

## 2019-07-05 NOTE — Progress Notes (Signed)
Called to room to assist during endoscopic procedure.  Patient ID and intended procedure confirmed with present staff. Received instructions for my participation in the procedure from the performing physician.  

## 2019-07-05 NOTE — Op Note (Signed)
[facility] Patient Name: Joe Proctor Procedure Date: 07/05/2019 9:30 AM MRN: [id number] Endoscopist: Jerene Bears , MD Age: 53 Referring MD:  Date of Birth: [birth year] Gender: Male Account #: [id number] Procedure:                Colonoscopy Indications:              Screening for colorectal malignant neoplasm Medicines:                Monitored Anesthesia Care Procedure:                Pre-Anesthesia Assessment:                           - Prior to the procedure, a History and Physical                            was performed, and patient medications and                            allergies were reviewed. The patient's tolerance of                            previous anesthesia was also reviewed. The risks                            and benefits of the procedure and the sedation                            options and risks were discussed with the patient.                            All questions were answered, and informed consent                            was obtained. Prior Anticoagulants: The patient has                            taken no previous anticoagulant or antiplatelet                            agents. ASA Grade Assessment: II - A patient with                            mild systemic disease. After reviewing the risks                            and benefits, the patient was deemed in                            satisfactory condition to undergo the procedure.                           After obtaining informed consent, the colonoscope  was passed under direct vision. Throughout the                            procedure, the patient's blood pressure, pulse, and                            oxygen saturations were monitored continuously. The                            Colonoscope was introduced through the anus and                            advanced to the cecum, identified by appendiceal                            orifice and ileocecal  valve. The colonoscopy was                            performed without difficulty. The patient tolerated                            the procedure well. The quality of the bowel                            preparation was good. The ileocecal valve,                            appendiceal orifice, and rectum were photographed. Scope In: 9:33:20 AM Scope Out: 9:51:25 AM Scope Withdrawal Time: 0 hours 15 minutes 52 seconds  Total Procedure Duration: 0 hours 18 minutes 5 seconds  Findings:                 The digital rectal exam was normal.                           Two sessile polyps were found in the ascending                            colon. The polyps were 2 to 9 mm in size. These                            polyps were removed with a cold snare. Resection                            and retrieval were complete.                           A 6 mm polyp was found in the transverse colon. The                            polyp was sessile. The polyp was removed with a  cold snare. Resection and retrieval were complete.                           Three sessile polyps were found in the sigmoid                            colon. The polyps were 4 to 7 mm in size. These                            polyps were removed with a cold snare. Resection                            and retrieval were complete.                           Multiple small and large-mouthed diverticula were                            found in the sigmoid colon.                           Internal hemorrhoids were found during                            retroflexion. The hemorrhoids were small. Complications:            No immediate complications. Estimated Blood Loss:     Estimated blood loss was minimal. Impression:               - Two 2 to 9 mm polyps in the ascending colon,                            removed with a cold snare. Resected and retrieved.                           - One 6 mm polyp in the  transverse colon, removed                            with a cold snare. Resected and retrieved.                           - Three 4 to 7 mm polyps in the sigmoid colon,                            removed with a cold snare. Resected and retrieved.                           - Diverticulosis in the sigmoid colon.                           - Small internal hemorrhoids. Recommendation:           - Patient has a contact number available for  emergencies. The signs and symptoms of potential                            delayed complications were discussed with the                            patient. Return to normal activities tomorrow.                            Written discharge instructions were provided to the                            patient.                           - Resume previous diet.                           - Continue present medications.                           - Await pathology results.                           - Repeat colonoscopy is recommended. The                            colonoscopy date will be determined after pathology                            results from today's exam become available for                            review. Jerene Bears, MD 07/05/2019 9:54:52 AM This report has been signed electronically.

## 2019-07-07 ENCOUNTER — Telehealth: Payer: Self-pay | Admitting: *Deleted

## 2019-07-07 ENCOUNTER — Encounter: Payer: Self-pay | Admitting: Internal Medicine

## 2019-07-07 NOTE — Telephone Encounter (Signed)
  Follow up Call-  Call back number 07/05/2019  Post procedure Call Back phone  # [phone]  Permission to leave phone message Yes  Some recent data might be hidden     Patient questions:  Do you have a fever, pain , or abdominal swelling? No. Pain Score  0 *  Have you tolerated food without any problems? Yes.    Have you been able to return to your normal activities? Yes.    Do you have any questions about your discharge instructions: Diet   No. Medications  No. Follow up visit  No.  Do you have questions or concerns about your Care? No.  Actions: * If pain score is 4 or above: No action needed, pain <4.  1. Have you developed a fever since your procedure? no  2.   Have you had an respiratory symptoms (SOB or cough) since your procedure? no  3.   Have you tested positive for COVID 19 since your procedure no  4.   Have you had any family members/close contacts diagnosed with the COVID 19 since your procedure?  no   If yes to any of these questions please route to Joylene John, RN and Alphonsa Gin, Therapist, sports.

## 2019-07-07 NOTE — Telephone Encounter (Signed)
  Follow up Call-  Call back number 07/05/2019  Post procedure Call Back phone  # [phone]  Permission to leave phone message Yes  Some recent data might be hidden     Patient questions:  Do you have a fever, pain , or abdominal swelling? No. Pain Score  0 *  Have you tolerated food without any problems? Yes.    Have you been able to return to your normal activities? Yes.    Do you have any questions about your discharge instructions: Diet   No. Medications  No. Follow up visit  No.  Do you have questions or concerns about your Care? No.  Actions: * If pain score is 4 or above: No action needed, pain <4.  1. Have you developed a fever since your procedure? no  2.   Have you had an respiratory symptoms (SOB or cough) since your procedure? no  3.   Have you tested positive for COVID 19 since your procedure no  4.   Have you had any family members/close contacts diagnosed with the COVID 19 since your procedure?  no   If yes to any of these questions please route to Joylene John, RN and Alphonsa Gin, Therapist, sports.

## 2020-11-15 ENCOUNTER — Ambulatory Visit (INDEPENDENT_AMBULATORY_CARE_PROVIDER_SITE_OTHER): Payer: 59 | Admitting: Dermatology

## 2020-11-15 ENCOUNTER — Encounter: Payer: Self-pay | Admitting: Dermatology

## 2020-11-15 ENCOUNTER — Other Ambulatory Visit: Payer: Self-pay

## 2020-11-15 DIAGNOSIS — Z1283 Encounter for screening for malignant neoplasm of skin: Secondary | ICD-10-CM

## 2020-11-15 DIAGNOSIS — L821 Other seborrheic keratosis: Secondary | ICD-10-CM

## 2020-11-15 DIAGNOSIS — D1801 Hemangioma of skin and subcutaneous tissue: Secondary | ICD-10-CM

## 2020-11-15 DIAGNOSIS — L570 Actinic keratosis: Secondary | ICD-10-CM | POA: Diagnosis not present

## 2020-11-15 DIAGNOSIS — L281 Prurigo nodularis: Secondary | ICD-10-CM

## 2020-11-15 DIAGNOSIS — L738 Other specified follicular disorders: Secondary | ICD-10-CM

## 2020-11-27 ENCOUNTER — Encounter: Payer: Self-pay | Admitting: Dermatology

## 2020-11-27 NOTE — Progress Notes (Signed)
   Follow-Up Visit   Subjective  Joe Proctor is a 55 y.o. male who presents for the following: Annual Exam (No new concerns).  General skin examination.  Some crusts on arms and right ear. Location:  Duration:  Quality:  Associated Signs/Symptoms: Modifying Factors:  Severity:  Timing: Context:   Objective  Well appearing patient in no apparent distress; mood and affect are within normal limits. Objective  Mid Back: Waist up skin exam.  No atypical pigmented lesions or nonmelanoma skin cancer.  Objective  Right Ear (4): 4 mm gritty pink crust  Objective  Right Forearm - Anterior: Lichenified pink 4 mm papules compatible with prurigo nodularis but cannot rule out a component of ultraviolet damage.  Objective  Mid Back: Dozens of brown textured lesions.   Objective  Chest - Medial [facility]): Smooth 3 mm red papule  Objective  Right Buccal Cheek: 2 mm flesh-colored delled papule    A full examination was performed including scalp, head, eyes, ears, nose, lips, neck, chest, axillae, abdomen, back, buttocks, bilateral upper extremities, bilateral lower extremities, hands, feet, fingers, toes, fingernails, and toenails. All findings within normal limits unless otherwise noted below.   Assessment & Plan    Screening for malignant neoplasm of skin Mid Back  Yearly skin exam.  Encouraged to self examine twice annually.  Continued ultraviolet protection.  AK (actinic keratosis) (4) Right Ear  Destruction of lesion - Right Ear Complexity: simple   Destruction method: cryotherapy   Informed consent: discussed and consent obtained   Timeout:  patient name, date of birth, surgical site, and procedure verified Lesion destroyed using liquid nitrogen: Yes   Cryotherapy cycles:  3 Outcome: patient tolerated procedure well with no complications   Post-procedure details: wound care instructions given    Prurigo nodularis Right Forearm - Anterior  Destruction of  lesion - Right Forearm - Anterior Complexity: simple   Destruction method: cryotherapy   Informed consent: discussed and consent obtained   Timeout:  patient name, date of birth, surgical site, and procedure verified Lesion destroyed using liquid nitrogen: Yes   Cryotherapy cycles:  3 Outcome: patient tolerated procedure well with no complications   Post-procedure details: wound care instructions given    Seborrheic keratosis Mid Back  Benign no treatment needed.   Hemangioma of skin Chest - Medial [facility])  No intervention necessary  Sebaceous hyperplasia of face Right Buccal Cheek  Patient although this may resemble early basal cell carcinoma so if there is growth or bleeding he will return for biopsy      I, Lavonna Monarch, MD, have reviewed all documentation for this visit.  The documentation on 11/27/20 for the exam, diagnosis, procedures, and orders are all accurate and complete.

## 2021-08-14 ENCOUNTER — Telehealth: Payer: Self-pay | Admitting: Family Medicine

## 2021-08-14 DIAGNOSIS — Z125 Encounter for screening for malignant neoplasm of prostate: Secondary | ICD-10-CM

## 2021-08-14 DIAGNOSIS — Z0000 Encounter for general adult medical examination without abnormal findings: Secondary | ICD-10-CM

## 2021-08-14 DIAGNOSIS — Z13220 Encounter for screening for lipoid disorders: Secondary | ICD-10-CM

## 2021-08-14 NOTE — Telephone Encounter (Signed)
Patient has physical on 2/20 and needing labs done

## 2021-08-16 NOTE — Telephone Encounter (Signed)
Lipid, liver, metabolic 7, PSA, CBC, HIV antibody CDC guidelines, wellness

## 2021-08-16 NOTE — Telephone Encounter (Signed)
Patient informed that labs have been ordered. NPO after midnight except can have water and black coffee. Verbalized understanding

## 2021-08-25 LAB — HEPATIC FUNCTION PANEL
ALT: 22 IU/L (ref 0–44)
AST: 22 IU/L (ref 0–40)
Albumin: 4.5 g/dL (ref 3.8–4.9)
Alkaline Phosphatase: 87 IU/L (ref 44–121)
Bilirubin Total: 0.5 mg/dL (ref 0.0–1.2)
Bilirubin, Direct: 0.14 mg/dL (ref 0.00–0.40)
Total Protein: 6.9 g/dL (ref 6.0–8.5)

## 2021-08-25 LAB — BASIC METABOLIC PANEL
BUN/Creatinine Ratio: 18 (ref 9–20)
BUN: 21 mg/dL (ref 6–24)
CO2: 26 mmol/L (ref 20–29)
Calcium: 9.4 mg/dL (ref 8.7–10.2)
Chloride: 105 mmol/L (ref 96–106)
Creatinine, Ser: 1.14 mg/dL (ref 0.76–1.27)
Glucose: 92 mg/dL (ref 70–99)
Potassium: 5 mmol/L (ref 3.5–5.2)
Sodium: 144 mmol/L (ref 134–144)
eGFR: 75 mL/min/{1.73_m2} (ref >59)

## 2021-08-25 LAB — CBC WITH DIFFERENTIAL/PLATELET
Basophils Absolute: 0 x10E3/uL (ref 0.0–0.2)
Basos: 1 %
EOS (ABSOLUTE): 0.1 x10E3/uL (ref 0.0–0.4)
Eos: 2 %
Hematocrit: 40.1 % (ref 37.5–51.0)
Hemoglobin: 13.3 g/dL (ref 13.0–17.7)
Immature Grans (Abs): 0 x10E3/uL (ref 0.0–0.1)
Immature Granulocytes: 0 %
Lymphocytes Absolute: 1.8 x10E3/uL (ref 0.7–3.1)
Lymphs: 39 %
MCH: 27.3 pg (ref 26.6–33.0)
MCHC: 33.2 g/dL (ref 31.5–35.7)
MCV: 82 fL (ref 79–97)
Monocytes Absolute: 0.4 x10E3/uL (ref 0.1–0.9)
Monocytes: 8 %
Neutrophils Absolute: 2.3 x10E3/uL (ref 1.4–7.0)
Neutrophils: 50 %
Platelets: 180 x10E3/uL (ref 150–450)
RBC: 4.87 x10E6/uL (ref 4.14–5.80)
RDW: 12 % (ref 11.6–15.4)
WBC: 4.6 x10E3/uL (ref 3.4–10.8)

## 2021-08-25 LAB — HIV ANTIBODY (ROUTINE TESTING W REFLEX): HIV Screen 4th Generation wRfx: NONREACTIVE

## 2021-08-25 LAB — LIPID PANEL
Chol/HDL Ratio: 3.1 ratio (ref 0.0–5.0)
Cholesterol, Total: 155 mg/dL (ref 100–199)
HDL: 50 mg/dL (ref >39)
LDL Chol Calc (NIH): 89 mg/dL (ref 0–99)
Triglycerides: 84 mg/dL (ref 0–149)
VLDL Cholesterol Cal: 16 mg/dL (ref 5–40)

## 2021-08-25 LAB — PSA: Prostate Specific Ag, Serum: 0.7 ng/mL (ref 0.0–4.0)

## 2021-09-03 ENCOUNTER — Ambulatory Visit (INDEPENDENT_AMBULATORY_CARE_PROVIDER_SITE_OTHER): Payer: 59 | Admitting: Family Medicine

## 2021-09-03 ENCOUNTER — Other Ambulatory Visit: Payer: Self-pay

## 2021-09-03 ENCOUNTER — Encounter: Payer: Self-pay | Admitting: Family Medicine

## 2021-09-03 VITALS — BP 120/70 | Temp 97.9°F | Ht 69.0 in | Wt 183.8 lb

## 2021-09-03 DIAGNOSIS — Z0000 Encounter for general adult medical examination without abnormal findings: Secondary | ICD-10-CM

## 2021-09-03 NOTE — Progress Notes (Signed)
° °  Subjective:    Patient ID: Joe Proctor, male    DOB: [birth year], 56 y.o.   MRN: [id number]  HPI The patient comes in today for a wellness visit.  Patient does not smoke or drink Stays active Exercise regular basis Labs reviewed with patient in detail  A review of their health history was completed.  A review of medications was also completed.  Any needed refills; none  Eating habits: eating OK  Falls/  MVA accidents in past few months: none  Regular exercise: yes  Specialist pt sees on regular basis: chiropractor about left shoulder  Preventative health issues were discussed.   Additional concerns: none    Review of Systems     Objective:   Physical Exam General-in no acute distress Eyes-no discharge Lungs-respiratory rate normal, CTA CV-no murmurs,RRR Extremities skin warm dry no edema Neuro grossly normal Behavior normal, alert  The 10-year ASCVD risk score (Arnett DK, et al., 2019) is: 4.6%   Values used to calculate the score:     Age: 56 years     Sex: Male     Is Non-Hispanic African American: No     Diabetic: No     Tobacco smoker: No     Systolic Blood Pressure: 497 mmHg     Is BP treated: No     HDL Cholesterol: 50 mg/dL     Total Cholesterol: 155 mg/dL  Prostate exam normal     Assessment & Plan:  Adult wellness-complete.wellness physical was conducted today. Importance of diet and exercise were discussed in detail.  In addition to this a discussion regarding safety was also covered. We also reviewed over immunizations and gave recommendations regarding current immunization needed for age.  In addition to this additional areas were also touched on including: Preventative health exams needed:  Colonoscopy due December 2023 patient aware  Patient was advised yearly wellness exam Shingles vaccine recommended Aortic aneurysm screening at age 56 recommended

## 2021-09-03 NOTE — Patient Instructions (Signed)

## 2021-10-31 ENCOUNTER — Encounter: Payer: Self-pay | Admitting: Dermatology

## 2021-10-31 ENCOUNTER — Ambulatory Visit (INDEPENDENT_AMBULATORY_CARE_PROVIDER_SITE_OTHER): Payer: 59 | Admitting: Dermatology

## 2021-10-31 DIAGNOSIS — Z1283 Encounter for screening for malignant neoplasm of skin: Secondary | ICD-10-CM | POA: Diagnosis not present

## 2021-10-31 DIAGNOSIS — L820 Inflamed seborrheic keratosis: Secondary | ICD-10-CM | POA: Diagnosis not present

## 2021-10-31 DIAGNOSIS — L309 Dermatitis, unspecified: Secondary | ICD-10-CM

## 2021-10-31 DIAGNOSIS — D1801 Hemangioma of skin and subcutaneous tissue: Secondary | ICD-10-CM

## 2021-10-31 DIAGNOSIS — L821 Other seborrheic keratosis: Secondary | ICD-10-CM

## 2021-10-31 MED ORDER — CLOBETASOL PROPIONATE 0.05 % EX CREA: 1.0000 "application " | TOPICAL_CREAM | Freq: Two times a day (BID) | CUTANEOUS | 0 refills | Status: DC

## 2021-11-17 ENCOUNTER — Encounter: Payer: Self-pay | Admitting: Dermatology

## 2021-11-17 NOTE — Progress Notes (Signed)
? ?  Follow-Up Visit ?  ?Subjective  ?Joe Proctor is a 56 y.o. male who presents for the following: Annual Exam (New lesions on left chest, left shoulder & right thigh). ? ?Annual skin examination, check spot on leg and several other spots ?Location:  ?Duration:  ?Quality:  ?Associated Signs/Symptoms: ?Modifying Factors:  ?Severity:  ?Timing: ?Context:  ? ?Objective  ?Well appearing patient in no apparent distress; mood and affect are within normal limits. ?Full body exam: No atypical pigmented lesions (all checked with dermoscopy), no current nonmelanoma skin cancer ? ?Chest - Medial 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 ?Left Shoulder - Anterior, Mid Back (6) ?Noninflamed flattopped 6 mm textured papule ? ?Right Thigh - Anterior ?Pink inflamed textured 7 mm papule, some lichenification suggesting scratching ? ? ? ?A full examination was performed including scalp, head, eyes, ears, nose, lips, neck, chest, axillae, abdomen, back, buttocks, bilateral upper extremities, bilateral lower extremities, hands, feet, fingers, toes, fingernails, and toenails. All findings within normal limits unless otherwise noted below. ? ? ?Assessment & Plan  ? ? ?Encounter for screening for malignant neoplasm of skin ? ?Annual skin examination, continue ultraviolet protection ? ?Dermatitis ?Chest - Medial [facility]) ? ?Clobetasol daily after bathing for 3 weeks, taper if improves.  Avoid use on face and body folds. ? ?clobetasol cream (TEMOVATE) 0.05 % - Chest - Medial [facility]) ?Apply 1 application. topically 2 (two) times daily. ? ?Seborrheic keratosis (7) ?Left Shoulder - Anterior; Mid Back (6) ? ?Leave if stable ? ?Cherry angioma ?Right Abdomen (side) - Upper ? ?Seborrheic keratosis, inflamed ?Right Thigh - Anterior ? ?Destruction of lesion - Right Thigh - Anterior ?Complexity: simple   ?Destruction method: cryotherapy   ?Informed consent: discussed and consent  obtained   ?Lesion destroyed using liquid nitrogen: Yes   ?Cryotherapy cycles:  3 ?Outcome: patient tolerated procedure well with no complications   ? ? ? ? ? ?I, Lavonna Monarch, MD, have reviewed all documentation for this visit.  The documentation on 11/17/21 for the exam, diagnosis, procedures, and orders are all accurate and complete. ?

## 2022-01-08 ENCOUNTER — Ambulatory Visit (INDEPENDENT_AMBULATORY_CARE_PROVIDER_SITE_OTHER): Payer: 59 | Admitting: Dermatology

## 2022-01-08 DIAGNOSIS — D485 Neoplasm of uncertain behavior of skin: Secondary | ICD-10-CM | POA: Diagnosis not present

## 2022-01-08 DIAGNOSIS — L821 Other seborrheic keratosis: Secondary | ICD-10-CM

## 2022-02-03 ENCOUNTER — Encounter: Payer: Self-pay | Admitting: Dermatology

## 2022-02-03 NOTE — Progress Notes (Signed)
   Follow-Up Visit   Subjective  Joe Proctor is a 56 y.o. male who presents for the following: Follow-up (Follow up on chest dermitis. Use clobetasol cream x 1 month Not much help. Also check left upper arm. ).  Persistent inflamed spots on chest and possible new spot on arm Location:  Duration:  Quality:  Associated Signs/Symptoms: Modifying Factors:  Severity:  Timing: Context:   Objective  Well appearing patient in no apparent distress; mood and affect are within normal limits. Left Breast Pink waxy 9 mm flat crust, rule out superficial carcinoma     Chest - Medial (Center) 4 mm inflamed pearly crust     Left Upper Arm - Posterior Subtle tan textured 6 mm flattopped papule    All skin waist up examined.   Assessment & Plan    Neoplasm of uncertain behavior of skin (2) Left Breast  Skin / nail biopsy Type of biopsy: tangential   Informed consent: discussed and consent obtained   Timeout: patient name, date of birth, surgical site, and procedure verified   Anesthesia: the lesion was anesthetized in a standard fashion   Anesthetic:  1% lidocaine w/ epinephrine 1-100,000 local infiltration Instrument used: flexible razor blade   Hemostasis achieved with: ferric subsulfate   Outcome: patient tolerated procedure well   Post-procedure details: wound care instructions given    Specimen 1 - Surgical pathology Differential Diagnosis: grovers  Check Margins: No  Chest - Medial (Center)  Skin / nail biopsy Type of biopsy: tangential   Informed consent: discussed and consent obtained   Timeout: patient name, date of birth, surgical site, and procedure verified   Anesthesia: the lesion was anesthetized in a standard fashion   Anesthetic:  1% lidocaine w/ epinephrine 1-100,000 local infiltration Instrument used: flexible razor blade   Hemostasis achieved with: ferric subsulfate   Outcome: patient tolerated procedure well   Post-procedure details: wound  care instructions given    Specimen 2 - Surgical pathology Differential Diagnosis: grovers  Check Margins: No  Seborrheic keratosis Left Upper Arm - Posterior  Leave if stable      I, Lavonna Monarch, MD, have reviewed all documentation for this visit.  The documentation on 02/03/22 for the exam, diagnosis, procedures, and orders are all accurate and complete.

## 2022-07-30 ENCOUNTER — Encounter: Payer: Self-pay | Admitting: Internal Medicine

## 2023-04-14 ENCOUNTER — Telehealth: Payer: Self-pay | Admitting: Family Medicine

## 2023-04-14 NOTE — Telephone Encounter (Signed)
Patient is requesting labs has appointment next week.

## 2023-04-15 NOTE — Telephone Encounter (Signed)
CBC, lipid, liver, metabolic 7, PSA-wellness, screening hyperlipidemia, screening PSA/prostate cancer

## 2023-04-16 ENCOUNTER — Other Ambulatory Visit: Payer: Self-pay

## 2023-04-16 DIAGNOSIS — Z13220 Encounter for screening for lipoid disorders: Secondary | ICD-10-CM

## 2023-04-16 DIAGNOSIS — Z0000 Encounter for general adult medical examination without abnormal findings: Secondary | ICD-10-CM

## 2023-04-16 DIAGNOSIS — Z125 Encounter for screening for malignant neoplasm of prostate: Secondary | ICD-10-CM

## 2023-04-16 NOTE — Telephone Encounter (Signed)
Labs have been ordered and pt has been informed

## 2023-04-18 LAB — BASIC METABOLIC PANEL
BUN/Creatinine Ratio: 16 (ref 9–20)
BUN: 20 mg/dL (ref 6–24)
CO2: 26 mmol/L (ref 20–29)
Calcium: 9.6 mg/dL (ref 8.7–10.2)
Chloride: 101 mmol/L (ref 96–106)
Creatinine, Ser: 1.25 mg/dL (ref 0.76–1.27)
Glucose: 85 mg/dL (ref 70–99)
Potassium: 5.2 mmol/L (ref 3.5–5.2)
Sodium: 140 mmol/L (ref 134–144)
eGFR: 67 mL/min/{1.73_m2} (ref >59)

## 2023-04-18 LAB — CBC WITH DIFFERENTIAL/PLATELET
Basophils Absolute: 0 10*3/uL (ref 0.0–0.2)
Basos: 1 %
EOS (ABSOLUTE): 0.1 10*3/uL (ref 0.0–0.4)
Eos: 2 %
Hematocrit: 42.2 % (ref 37.5–51.0)
Hemoglobin: 13.7 g/dL (ref 13.0–17.7)
Immature Grans (Abs): 0 10*3/uL (ref 0.0–0.1)
Immature Granulocytes: 0 %
Lymphocytes Absolute: 1.8 10*3/uL (ref 0.7–3.1)
Lymphs: 36 %
MCH: 27.8 pg (ref 26.6–33.0)
MCHC: 32.5 g/dL (ref 31.5–35.7)
MCV: 86 fL (ref 79–97)
Monocytes Absolute: 0.3 10*3/uL (ref 0.1–0.9)
Monocytes: 7 %
Neutrophils Absolute: 2.7 10*3/uL (ref 1.4–7.0)
Neutrophils: 54 %
Platelets: 194 10*3/uL (ref 150–450)
RBC: 4.92 x10E6/uL (ref 4.14–5.80)
RDW: 12.4 % (ref 11.6–15.4)
WBC: 5 10*3/uL (ref 3.4–10.8)

## 2023-04-18 LAB — LIPID PANEL
Chol/HDL Ratio: 3.1 {ratio} (ref 0.0–5.0)
Cholesterol, Total: 154 mg/dL (ref 100–199)
HDL: 49 mg/dL (ref >39)
LDL Chol Calc (NIH): 86 mg/dL (ref 0–99)
Triglycerides: 104 mg/dL (ref 0–149)
VLDL Cholesterol Cal: 19 mg/dL (ref 5–40)

## 2023-04-18 LAB — HEPATIC FUNCTION PANEL
ALT: 22 [IU]/L (ref 0–44)
AST: 22 [IU]/L (ref 0–40)
Albumin: 4.6 g/dL (ref 3.8–4.9)
Alkaline Phosphatase: 90 [IU]/L (ref 44–121)
Bilirubin Total: 0.8 mg/dL (ref 0.0–1.2)
Bilirubin, Direct: 0.18 mg/dL (ref 0.00–0.40)
Total Protein: 6.8 g/dL (ref 6.0–8.5)

## 2023-04-18 LAB — PSA: Prostate Specific Ag, Serum: 0.9 ng/mL (ref 0.0–4.0)

## 2023-04-23 ENCOUNTER — Ambulatory Visit: Payer: 59 | Admitting: Family Medicine

## 2023-04-23 VITALS — BP 129/73 | HR 47 | Temp 98.1°F | Ht 70.5 in | Wt 181.4 lb

## 2023-04-23 DIAGNOSIS — Z0000 Encounter for general adult medical examination without abnormal findings: Secondary | ICD-10-CM | POA: Diagnosis not present

## 2023-04-23 DIAGNOSIS — Z23 Encounter for immunization: Secondary | ICD-10-CM

## 2023-04-23 NOTE — Progress Notes (Unsigned)
Subjective:    Patient ID: Joe Proctor, male    DOB: Feb 10, 1966, 57 y.o.   MRN: [id number]  HPI The patient comes in today for a wellness visit.  The 10-year ASCVD risk score (Arnett DK, et al., 2019) is: 5.8%   Values used to calculate the score:     Age: 66 years     Sex: Male     Is Non-Hispanic African American: No     Diabetic: No     Tobacco smoker: No     Systolic Blood Pressure: 129 mmHg     Is BP treated: No     HDL Cholesterol: 49 mg/dL     Total Cholesterol: 154 mg/dL   A review of their health history was completed.  A review of medications was also completed. .hpsec Discussed the use of AI scribe software for clinical note transcription with the patient, who gave verbal consent to proceed.  History of Present Illness   The patient, an active individual with a history of polyps, presents for a routine check-up. He reports maintaining a healthy lifestyle, including regular exercise three times a week, consisting of strength training and running. His diet is primarily chicken, with occasional red meat and minimal fried foods, although he acknowledges a need for increased vegetable intake. He reports adequate hydration with water, unsweetened tea, and diet coke. Urination patterns are normal, with occasional nocturia related to fluid intake. Sleep patterns are regular, with bedtime around 9:30 PM and waking up around 4:45 AM.  The patient reports a good handle on stress, improved from the last visit, and feels cognitively well. He expresses satisfaction with his overall health status and recent blood work results. He had a colonoscopy in December 2020, where tubular adenomas were found, and is due for a follow-up as per the five-year plan suggested by his gastroenterologist.  The patient reports regular bowel movements and no hematuria. Vision has improved with the use of multifocal contact lenses, and hearing is preserved with precautions taken around loud noises. He  reports no swelling in the legs or any other significant symptoms.  The patient has a family history of heart disease, with his father having had stents placed. He enjoys outdoor activities such as camping and golf. He has been considering getting the shingles vaccine and is open to a prostate exam as part of his routine check-up. He has a history of chickenpox and has been consuming protein shakes, which he is willing to cut back on.      Any needed refills; No  Eating habits: Good  Falls/  MVA accidents in past few months: No  Regular exercise: Yes  Specialist pt sees on regular basis: Chiropractor   Preventative health issues were discussed.   Additional concerns: None at this time   Review of Systems     Objective:   Physical Exam Physical Exam   HEENT: Eardrum looks good, small amount of wax in one ear. Teeth holding up well. NECK: Neck feels fine. CHEST: Lungs sound good. CARDIOVASCULAR: Heart sounds great. ABDOMEN: Abdomen is soft, non-tender, no swelling issues in legs. RECTAL: Prostate feels fine, normal size, soft, non-nodules.            Assessment & Plan:  Assessment and Plan    Colon Polyps History of tubular adenomas removed in December 2020. Due for repeat colonoscopy as per gastroenterologist's recommendation. -Notify Dr. Alcide Clever office for scheduling of repeat colonoscopy.  Cardiovascular Risk Average 10-year risk for heart disease despite  good overall health and cholesterol profile. Discussed the benefits of coronary calcium scan for further risk stratification. -Consider coronary calcium scan for further risk assessment.  Prostate Health Normal PSA and prostate exam. Discussed the approach to prostate cancer screening and the potential need for less frequent screening as patient ages. -Continue annual PSA and prostate exams.  Nutrition and Exercise Regular exercise and mindful eating habits. Discussed potential kidney stress from excessive  protein intake. -Avoid excessive protein intake, particularly from protein shakes.  Vaccination Discussed the benefits and potential side effects of Shingrix vaccine for shingles prevention. -Consider Shingrix vaccination.  Follow-up Continue current health maintenance and preventive measures. Regular follow-up visits for monitoring.

## 2023-04-23 NOTE — Patient Instructions (Signed)
Shingrix and shingles prevention: know the facts!   Shingrix is a very effective vaccine to prevent shingles.   Shingles is a reactivation of chickenpox -more than 99% of Americans born before 1980 have had chickenpox even if they do not remember it. One in every 10 people who get shingles have severe long-lasting nerve pain as a result.   33 out of a 100 older adults will get shingles if they are unvaccinated.     This vaccine is very important for your health This vaccine is indicated for anyone 50 years or older. You can get this vaccine even if you have already had shingles because you can get the disease more than once in a lifetime.  Your risk for shingles and its complications increases with age.  This vaccine has 2 doses.  The second dose would be 2 to 6 months after the first dose.  If you had Zostavax vaccine in the past you should still get Shingrix. ( Zostavax is only 70% effective and it loses significant strength over a few years .)  This vaccine is given through the pharmacy.  The cost of the vaccine is through your insurance. The pharmacy can inform you of the total costs.  Common side effects including soreness in the arm, some redness and swelling, also some feel fatigue muscle soreness headache low-grade fever.  Side effects typically go away within 2 to 3 days. Remember-the pain from shingles can last a lifetime but these side effects of the vaccine will only last a few days at most. It is very important to get both doses in order to protect yourself fully.   Please get this vaccine at your earliest convenience at your trusted pharmacy.  Results for orders placed or performed in visit on 04/16/23  CBC with Differential  Result Value Ref Range   WBC 5.0 3.4 - 10.8 x10E3/uL   RBC 4.92 4.14 - 5.80 x10E6/uL   Hemoglobin 13.7 13.0 - 17.7 g/dL   Hematocrit 16.1 09.6 - 51.0 %   MCV 86 79 - 97 fL   MCH 27.8 26.6 - 33.0 pg   MCHC 32.5 31.5 - 35.7 g/dL   RDW 04.5 40.9  - 81.1 %   Platelets 194 150 - 450 x10E3/uL   Neutrophils 54 Not Estab. %   Lymphs 36 Not Estab. %   Monocytes 7 Not Estab. %   Eos 2 Not Estab. %   Basos 1 Not Estab. %   Neutrophils Absolute 2.7 1.4 - 7.0 x10E3/uL   Lymphocytes Absolute 1.8 0.7 - 3.1 x10E3/uL   Monocytes Absolute 0.3 0.1 - 0.9 x10E3/uL   EOS (ABSOLUTE) 0.1 0.0 - 0.4 x10E3/uL   Basophils Absolute 0.0 0.0 - 0.2 x10E3/uL   Immature Granulocytes 0 Not Estab. %   Immature Grans (Abs) 0.0 0.0 - 0.1 x10E3/uL  Lipid Panel  Result Value Ref Range   Cholesterol, Total 154 100 - 199 mg/dL   Triglycerides 914 0 - 149 mg/dL   HDL 49 >78 mg/dL   VLDL Cholesterol Cal 19 5 - 40 mg/dL   LDL Chol Calc (NIH) 86 0 - 99 mg/dL   Chol/HDL Ratio 3.1 0.0 - 5.0 ratio  Hepatic Function Panel  Result Value Ref Range   Total Protein 6.8 6.0 - 8.5 g/dL   Albumin 4.6 3.8 - 4.9 g/dL   Bilirubin Total 0.8 0.0 - 1.2 mg/dL   Bilirubin, Direct 2.95 0.00 - 0.40 mg/dL   Alkaline Phosphatase 90 44 - 121 IU/L  AST 22 0 - 40 IU/L   ALT 22 0 - 44 IU/L  Basic Metabolic Panel  Result Value Ref Range   Glucose 85 70 - 99 mg/dL   BUN 20 6 - 24 mg/dL   Creatinine, Ser 6.04 0.76 - 1.27 mg/dL   eGFR 67 >54 UJ/WJX/9.14   BUN/Creatinine Ratio 16 9 - 20   Sodium 140 134 - 144 mmol/L   Potassium 5.2 3.5 - 5.2 mmol/L   Chloride 101 96 - 106 mmol/L   CO2 26 20 - 29 mmol/L   Calcium 9.6 8.7 - 10.2 mg/dL  PSA  Result Value Ref Range   Prostate Specific Ag, Serum 0.9 0.0 - 4.0 ng/mL

## 2023-05-22 ENCOUNTER — Ambulatory Visit (HOSPITAL_COMMUNITY)
Admission: RE | Admit: 2023-05-22 | Discharge: 2023-05-22 | Disposition: A | Discharge status: Home / Self Care | Payer: 59 | Source: Ambulatory Visit | Attending: Family Medicine | Admitting: Family Medicine

## 2023-05-22 DIAGNOSIS — Z0000 Encounter for general adult medical examination without abnormal findings: Secondary | ICD-10-CM | POA: Insufficient documentation

## 2023-05-30 ENCOUNTER — Encounter: Payer: Self-pay | Admitting: Internal Medicine

## 2023-08-04 ENCOUNTER — Ambulatory Visit: Payer: 59 | Admitting: *Deleted

## 2023-08-04 ENCOUNTER — Telehealth: Payer: Self-pay

## 2023-08-04 VITALS — Ht 70.5 in | Wt 175.0 lb

## 2023-08-04 DIAGNOSIS — Z860100 Personal history of colon polyps, unspecified: Secondary | ICD-10-CM

## 2023-08-04 MED ORDER — SUFLAVE 178.7 G PO SOLR: 1.0000 | ORAL | 0 refills | Status: DC

## 2023-08-04 MED ORDER — SUFLAVE 178.7 G PO SOLR
1.0000 | ORAL | 0 refills | Status: DC
Start: 2023-08-04 — End: 2023-09-08

## 2023-08-04 NOTE — Progress Notes (Signed)
Previsit today with Faustino Congress, RN RX for bowel prep, Suflave sent to [facility] pharmacy per standing orders.

## 2023-08-04 NOTE — Telephone Encounter (Signed)
Bowel prep Suflave sent to pharmacy, previsit today with [facility]

## 2023-08-04 NOTE — Progress Notes (Signed)
 Pt's name and DOB verified at the beginning of the pre-visit wit 2 identifiers  Pt denies any difficulty with ambulating,sitting, laying down or rolling side to side  Pt has no issues with ambulation   Pt has no issues moving head neck or swallowing  No egg or soy allergy known to patient   No issues known to pt with past sedation with any surgeries or procedures  Patient denies ever being intubated  No FH of Malignant Hyperthermia  Pt is not on diet pills or shots  Pt is not on home 02   Pt is not on blood thinners   Pt denies issues with constipation   Pt is not on dialysis  Pt denise any abnormal heart rhythms   Pt denies any upcoming cardiac testing  Pt encouraged to use to use Singlecare or Goodrx to reduce cost   Patient's chart reviewed by Cathlyn Parsons CNRA prior to pre-visit and patient appropriate for the [facility].  Pre-visit completed and red dot placed by patient's name on their procedure day (on provider's schedule).  .  Visit by phone  Pt states weight is 175 lb  Instructed pt why it is important to and  to call if they have any changes in health or new medications. Directed them to the # given and on instructions.     Instructions reviewed. Pt given both [facility] main # and MD on call # prior to instructions.  Pt states understanding. Instructed to review again prior to procedure. Pt states they will.   Instructions sent by mail with coupon and by My Chart  Coupon sent via text to mobile phone and pt verified they received it

## 2023-09-03 ENCOUNTER — Encounter: Payer: Self-pay | Admitting: Internal Medicine

## 2023-09-04 ENCOUNTER — Encounter: Payer: Self-pay | Admitting: Certified Registered Nurse Anesthetist

## 2023-09-08 ENCOUNTER — Ambulatory Visit (AMBULATORY_SURGERY_CENTER): Payer: 59 | Admitting: Internal Medicine

## 2023-09-08 ENCOUNTER — Encounter: Payer: Self-pay | Admitting: Internal Medicine

## 2023-09-08 VITALS — BP 113/66 | HR 52 | Temp 99.0°F | Resp 11 | Ht 70.5 in | Wt 175.0 lb

## 2023-09-08 DIAGNOSIS — K5730 Diverticulosis of large intestine without perforation or abscess without bleeding: Secondary | ICD-10-CM

## 2023-09-08 DIAGNOSIS — K648 Other hemorrhoids: Secondary | ICD-10-CM | POA: Diagnosis not present

## 2023-09-08 DIAGNOSIS — Z1211 Encounter for screening for malignant neoplasm of colon: Secondary | ICD-10-CM

## 2023-09-08 DIAGNOSIS — D124 Benign neoplasm of descending colon: Secondary | ICD-10-CM | POA: Diagnosis not present

## 2023-09-08 DIAGNOSIS — D123 Benign neoplasm of transverse colon: Secondary | ICD-10-CM

## 2023-09-08 DIAGNOSIS — Z860101 Personal history of adenomatous and serrated colon polyps: Secondary | ICD-10-CM | POA: Diagnosis not present

## 2023-09-08 DIAGNOSIS — Z860100 Personal history of colon polyps, unspecified: Secondary | ICD-10-CM

## 2023-09-08 MED ORDER — SODIUM CHLORIDE 0.9 % IV SOLN: 500.0000 mL | Freq: Once | INTRAVENOUS | Status: DC

## 2023-09-08 NOTE — Progress Notes (Signed)
 Reviewed history and no changes since last visit.

## 2023-09-08 NOTE — Progress Notes (Signed)
 GASTROENTEROLOGY PROCEDURE H&P NOTE   Primary Care Physician: Babs Sciara, MD    Reason for Procedure:   Hx of SSP and TA  Plan:    colonoscopy  Patient is appropriate for endoscopic procedure(s) in the ambulatory ([facility]) setting.  The nature of the procedure, as well as the risks, benefits, and alternatives were carefully and thoroughly reviewed with the patient. Ample time for discussion and questions allowed. The patient understood, was satisfied, and agreed to proceed.     HPI: Joe Proctor is a 58 y.o. male who presents for surveillance colonoscopy.  Medical history as below.  Tolerated the prep.  No recent chest pain or shortness of breath.  No abdominal pain today.  Past Medical History:  Diagnosis Date   Cancer [facility])    skin    Past Surgical History:  Procedure Laterality Date   COLONOSCOPY  2007   UPPER GASTROINTESTINAL ENDOSCOPY      Prior to Admission medications   Medication Sig Start Date End Date Taking? Authorizing Provider  Multiple Vitamins-Minerals (MULTIVITAMIN WITH MINERALS) tablet Take 1 tablet by mouth daily.    [provider]  OVER THE COUNTER MEDICATION Allergy med    [provider]    Current Outpatient Medications  Medication Sig Dispense Refill   Multiple Vitamins-Minerals (MULTIVITAMIN WITH MINERALS) tablet Take 1 tablet by mouth daily.     OVER THE COUNTER MEDICATION Allergy med     Current Facility-Administered Medications  Medication Dose Route Frequency Provider Last Rate Last Admin   0.9 %  sodium chloride infusion  500 mL Intravenous Once Jayle Solarz, Carie Caddy, MD        Allergies as of 09/08/2023   (No Known Allergies)    Family History  Problem Relation Age of Onset   Diabetes Other    Cancer Other        lung   Cancer Other        liver   Hypertension Mother    Colon cancer Neg Hx    Colon polyps Neg Hx    Esophageal cancer Neg Hx    Stomach cancer Neg Hx    Rectal cancer Neg Hx     Social  History   Socioeconomic History   Marital status: Married    Spouse name: Not on file   Number of children: Not on file   Years of education: Not on file   Highest education level: Not on file  Occupational History   Not on file  Tobacco Use   Smoking status: Former    Current packs/day: 0.00    Types: Cigarettes    Quit date: 1990    Years since quitting: 35.1   Smokeless tobacco: Never  Vaping Use   Vaping status: Never Used  Substance and Sexual Activity   Alcohol use: Never   Drug use: Not Currently   Sexual activity: Not on file  Other Topics Concern   Not on file  Social History Narrative   Not on file   Social Drivers of Health   Financial Resource Strain: Not on file  Food Insecurity: Not on file  Transportation Needs: Not on file  Physical Activity: Not on file  Stress: Not on file  Social Connections: Not on file  Intimate Partner Violence: Not on file    Physical Exam: Vital signs in last 24 hours: @BP  (!) 159/77   Pulse (!) 46   Temp 99 F (37.2 C)   Ht 5' 10.5" (1.791  m)   Wt 175 lb (79.4 kg)   SpO2 99%   BMI 24.75 kg/m  GEN: NAD EYE: Sclerae anicteric ENT: MMM CV: Non-tachycardic Pulm: CTA b/l GI: Soft, NT/ND NEURO:  Alert & Oriented x 3   Erick Blinks, MD [facility]  09/08/2023 7:56 AM

## 2023-09-08 NOTE — Progress Notes (Signed)
 Report given to PACU, vss

## 2023-09-08 NOTE — Patient Instructions (Signed)
 DISCHARGE INSTRUCTIONS GIVEN. HANDOUTS ON POLYPS,DIVERTICULOSIS AND Hemorrhoids. Resume previous medications. YOU HAD AN ENDOSCOPIC PROCEDURE TODAY AT THE [facility]:   Refer to the procedure report that was given to you for any specific questions about what was found during the examination.  If the procedure report does not answer your questions, please call your gastroenterologist to clarify.  If you requested that your care partner not be given the details of your procedure findings, then the procedure report has been included in a sealed envelope for you to review at your convenience later.  YOU SHOULD EXPECT: Some feelings of bloating in the abdomen. Passage of more gas than usual.  Walking can help get rid of the air that was put into your GI tract during the procedure and reduce the bloating. If you had a lower endoscopy (such as a colonoscopy or flexible sigmoidoscopy) you may notice spotting of blood in your stool or on the toilet paper. If you underwent a bowel prep for your procedure, you may not have a normal bowel movement for a few days.  Please Note:  You might notice some irritation and congestion in your nose or some drainage.  This is from the oxygen used during your procedure.  There is no need for concern and it should clear up in a day or so.  SYMPTOMS TO REPORT IMMEDIATELY:  Following lower endoscopy (colonoscopy or flexible sigmoidoscopy):  Excessive amounts of blood in the stool  Significant tenderness or worsening of abdominal pains  Swelling of the abdomen that is new, acute  Fever of 100F or higher   For urgent or emergent issues, a gastroenterologist can be reached at any hour by calling [phone]. Do not use MyChart messaging for urgent concerns.    DIET:  We do recommend a small meal at first, but then you may proceed to your regular diet.  Drink plenty of fluids but you should avoid alcoholic beverages for 24 hours.  ACTIVITY:  You should  plan to take it easy for the rest of today and you should NOT DRIVE or use heavy machinery until tomorrow (because of the sedation medicines used during the test).    FOLLOW UP: Our staff will call the number listed on your records the next business day following your procedure.  We will call around 7:15- 8:00 am to check on you and address any questions or concerns that you may have regarding the information given to you following your procedure. If we do not reach you, we will leave a message.     If any biopsies were taken you will be contacted by phone or by letter within the next 1-3 weeks.  Please call us at [phone] if you have not heard about the biopsies in 3 weeks.    SIGNATURES/CONFIDENTIALITY: You and/or your care partner have signed paperwork which will be entered into your electronic medical record.  These signatures attest to the fact that that the information above on your After Visit Summary has been reviewed and is understood.  Full responsibility of the confidentiality of this discharge information lies with you and/or your care-partner.

## 2023-09-08 NOTE — Progress Notes (Signed)
 Called to room to assist during endoscopic procedure.  Patient ID and intended procedure confirmed with present staff. Received instructions for my participation in the procedure from the performing physician.

## 2023-09-08 NOTE — Op Note (Signed)
 [facility] Patient Name: Joe Proctor Procedure Date: 09/08/2023 8:01 AM MRN: [id number] Endoscopist: Beverley Fiedler , MD, [id number] Age: 58 Referring MD:  Date of Birth: 03-Dec-1965 Gender: Male Account #: [id number] Procedure:                Colonoscopy Indications:              High risk colon cancer surveillance: Personal                            history of multiple sessile serrated polyps and                            adenomas, Last colonoscopy: December 2020 (SSP and                            TA x 5) Medicines:                Monitored Anesthesia Care Procedure:                Pre-Anesthesia Assessment:                           - Prior to the procedure, a History and Physical                            was performed, and patient medications and                            allergies were reviewed. The patient's tolerance of                            previous anesthesia was also reviewed. The risks                            and benefits of the procedure and the sedation                            options and risks were discussed with the patient.                            All questions were answered, and informed consent                            was obtained. Prior Anticoagulants: The patient has                            taken no anticoagulant or antiplatelet agents. ASA                            Grade Assessment: I - A normal, healthy patient.                            After reviewing the risks and benefits, the patient  was deemed in satisfactory condition to undergo the                            procedure.                           After obtaining informed consent, the colonoscope                            was passed under direct vision. Throughout the                            procedure, the patient's blood pressure, pulse, and                            oxygen saturations were monitored continuously. The                             CF [id number] #[id number] was introduced through the anus                            and advanced to the cecum, identified by palpation.                            The colonoscopy was performed without difficulty.                            The patient tolerated the procedure well. The                            quality of the bowel preparation was good. The                            ileocecal valve, appendiceal orifice, and rectum                            were photographed. Scope In: 8:08:35 AM Scope Out: 8:25:10 AM Scope Withdrawal Time: 0 hours 13 minutes 38 seconds  Total Procedure Duration: 0 hours 16 minutes 35 seconds  Findings:                 The digital rectal exam was normal.                           Two sessile polyps were found in the transverse                            colon. The polyps were 3 to 4 mm in size. These                            polyps were removed with a cold snare. Resection                            and retrieval were complete.  A 3 mm polyp was found in the descending colon. The                            polyp was sessile. The polyp was removed with a                            cold snare. Resection and retrieval were complete.                           Multiple medium-mouthed and small-mouthed                            diverticula were found in the sigmoid colon and                            descending colon.                           Internal hemorrhoids were found during                            retroflexion. The hemorrhoids were small. Complications:            No immediate complications. Estimated Blood Loss:     Estimated blood loss: none. Impression:               - Two 3 to 4 mm polyps in the transverse colon,                            removed with a cold snare. Resected and retrieved.                           - One 3 mm polyp in the descending colon, removed                            with a cold snare.  Resected and retrieved.                           - Moderate diverticulosis in the sigmoid colon and                            in the descending colon.                           - Internal hemorrhoids. Recommendation:           - Patient has a contact number available for                            emergencies. The signs and symptoms of potential                            delayed complications were discussed with the  patient. Return to normal activities tomorrow.                            Written discharge instructions were provided to the                            patient.                           - Resume previous diet.                           - Continue present medications.                           - Await pathology results.                           - Repeat colonoscopy is recommended for                            surveillance (likely in 5 years). The colonoscopy                            date will be determined after pathology results                            from today's exam become available for review. Beverley Fiedler, MD 09/08/2023 8:29:37 AM This report has been signed electronically.

## 2023-09-09 ENCOUNTER — Telehealth: Payer: Self-pay | Admitting: *Deleted

## 2023-09-09 NOTE — Telephone Encounter (Signed)
  Follow up Call-     09/08/2023    7:32 AM  Call back number  Post procedure Call Back phone  # [phone]  Permission to leave phone message Yes     Patient questions:  Do you have a fever, pain , or abdominal swelling? No. Pain Score  0 *  Have you tolerated food without any problems? Yes.    Have you been able to return to your normal activities? Yes.    Do you have any questions about your discharge instructions: Diet   No. Medications  No. Follow up visit  No.  Do you have questions or concerns about your Care? No.  Actions: * If pain score is 4 or above: No action needed, pain <4.

## 2023-09-10 LAB — SURGICAL PATHOLOGY

## 2023-09-11 ENCOUNTER — Encounter: Payer: Self-pay | Admitting: Internal Medicine

## 2024-05-18 ENCOUNTER — Ambulatory Visit (INDEPENDENT_AMBULATORY_CARE_PROVIDER_SITE_OTHER): Admitting: Family Medicine

## 2024-05-18 VITALS — BP 148/86 | HR 45 | Temp 98.1°F | Ht 70.5 in | Wt 181.1 lb

## 2024-05-18 DIAGNOSIS — Z125 Encounter for screening for malignant neoplasm of prostate: Secondary | ICD-10-CM

## 2024-05-18 DIAGNOSIS — I10 Essential (primary) hypertension: Secondary | ICD-10-CM | POA: Diagnosis not present

## 2024-05-18 DIAGNOSIS — Z13220 Encounter for screening for lipoid disorders: Secondary | ICD-10-CM

## 2024-05-18 MED ORDER — AMLODIPINE BESYLATE 2.5 MG PO TABS: 2.5000 mg | ORAL_TABLET | Freq: Every day | ORAL | 1 refills | Status: AC

## 2024-05-18 NOTE — Patient Instructions (Signed)
 Hypertension, Adult High blood pressure (hypertension) is when the force of blood pumping through the arteries is too strong. The arteries are the blood vessels that carry blood from the heart throughout the body. Hypertension forces the heart to work harder to pump blood and may cause arteries to become narrow or stiff. Untreated or uncontrolled hypertension can lead to a heart attack, heart failure, a stroke, kidney disease, and other problems. A blood pressure reading consists of a higher number over a lower number. Ideally, your blood pressure should be below 120/80. The first ("top") number is called the systolic pressure. It is a measure of the pressure in your arteries as your heart beats. The second ("bottom") number is called the diastolic pressure. It is a measure of the pressure in your arteries as the heart relaxes. What are the causes? The exact cause of this condition is not known. There are some conditions that result in high blood pressure. What increases the risk? Certain factors may make you more likely to develop high blood pressure. Some of these risk factors are under your control, including: Smoking. Not getting enough exercise or physical activity. Being overweight. Having too much fat, sugar, calories, or salt (sodium) in your diet. Drinking too much alcohol. Other risk factors include: Having a personal history of heart disease, diabetes, high cholesterol, or kidney disease. Stress. Having a family history of high blood pressure and high cholesterol. Having obstructive sleep apnea. Age. The risk increases with age. What are the signs or symptoms? High blood pressure may not cause symptoms. Very high blood pressure (hypertensive crisis) may cause: Headache. Fast or irregular heartbeats (palpitations). Shortness of breath. Nosebleed. Nausea and vomiting. Vision changes. Severe chest pain, dizziness, and seizures. How is this diagnosed? This condition is diagnosed by  measuring your blood pressure while you are seated, with your arm resting on a flat surface, your legs uncrossed, and your feet flat on the floor. The cuff of the blood pressure monitor will be placed directly against the skin of your upper arm at the level of your heart. Blood pressure should be measured at least twice using the same arm. Certain conditions can cause a difference in blood pressure between your right and left arms. If you have a high blood pressure reading during one visit or you have normal blood pressure with other risk factors, you may be asked to: Return on a different day to have your blood pressure checked again. Monitor your blood pressure at home for 1 week or longer. If you are diagnosed with hypertension, you may have other blood or imaging tests to help your health care provider understand your overall risk for other conditions. How is this treated? This condition is treated by making healthy lifestyle changes, such as eating healthy foods, exercising more, and reducing your alcohol intake. You may be referred for counseling on a healthy diet and physical activity. Your health care provider may prescribe medicine if lifestyle changes are not enough to get your blood pressure under control and if: Your systolic blood pressure is above 130. Your diastolic blood pressure is above 80. Your personal target blood pressure may vary depending on your medical conditions, your age, and other factors. Follow these instructions at home: Eating and drinking  Eat a diet that is high in fiber and potassium, and low in sodium, added sugar, and fat. An example of this eating plan is called the DASH diet. DASH stands for Dietary Approaches to Stop Hypertension. To eat this way: Eat  plenty of fresh fruits and vegetables. Try to fill one half of your plate at each meal with fruits and vegetables. Eat whole grains, such as whole-wheat pasta, brown rice, or whole-grain bread. Fill about one  fourth of your plate with whole grains. Eat or drink low-fat dairy products, such as skim milk or low-fat yogurt. Avoid fatty cuts of meat, processed or cured meats, and poultry with skin. Fill about one fourth of your plate with lean proteins, such as fish, chicken without skin, beans, eggs, or tofu. Avoid pre-made and processed foods. These tend to be higher in sodium, added sugar, and fat. Reduce your daily sodium intake. Many people with hypertension should eat less than 1,500 mg of sodium a day. Do not drink alcohol if: Your health care provider tells you not to drink. You are pregnant, may be pregnant, or are planning to become pregnant. If you drink alcohol: Limit how much you have to: 0-1 drink a day for women. 0-2 drinks a day for men. Know how much alcohol is in your drink. In the [place] one drink equals one 12 oz bottle of beer (355 mL), one 5 oz glass of wine (148 mL), or one 1 oz glass of hard liquor (44 mL). Lifestyle  Work with your health care provider to maintain a healthy body weight or to lose weight. Ask what an ideal weight is for you. Get at least 30 minutes of exercise that causes your heart to beat faster (aerobic exercise) most days of the week. Activities may include walking, swimming, or biking. Include exercise to strengthen your muscles (resistance exercise), such as Pilates or lifting weights, as part of your weekly exercise routine. Try to do these types of exercises for 30 minutes at least 3 days a week. Do not use any products that contain nicotine or tobacco. These products include cigarettes, chewing tobacco, and vaping devices, such as e-cigarettes. If you need help quitting, ask your health care provider. Monitor your blood pressure at home as told by your health care provider. Keep all follow-up visits. This is important. Medicines Take over-the-counter and prescription medicines only as told by your health care provider. Follow directions carefully. Blood  pressure medicines must be taken as prescribed. Do not skip doses of blood pressure medicine. Doing this puts you at risk for problems and can make the medicine less effective. Ask your health care provider about side effects or reactions to medicines that you should watch for. Contact a health care provider if you: Think you are having a reaction to a medicine you are taking. Have headaches that keep coming back (recurring). Feel dizzy. Have swelling in your ankles. Have trouble with your vision. Get help right away if you: Develop a severe headache or confusion. Have unusual weakness or numbness. Feel faint. Have severe pain in your chest or abdomen. Vomit repeatedly. Have trouble breathing. These symptoms may be an emergency. Get help right away. Call 911. Do not wait to see if the symptoms will go away. Do not drive yourself to the hospital. Summary Hypertension is when the force of blood pumping through your arteries is too strong. If this condition is not controlled, it may put you at risk for serious complications. Your personal target blood pressure may vary depending on your medical conditions, your age, and other factors. For most people, a normal blood pressure is less than 120/80. Hypertension is treated with lifestyle changes, medicines, or a combination of both. Lifestyle changes include losing weight, eating a healthy,  low-sodium diet, exercising more, and limiting alcohol. This information is not intended to replace advice given to you by your health care provider. Make sure you discuss any questions you have with your health care provider. Document Revised: 05/08/2021 Document Reviewed: 05/08/2021 Elsevier Patient Education  2024 [employer].

## 2024-05-18 NOTE — Progress Notes (Signed)
   Subjective:    Patient ID: Joe Proctor, male    DOB: 1966/06/15, 58 y.o.   MRN: [id number]  HPI High blood pressure is concerning.   Stress more than normal due to work.  BP readings yesterday afternoon 148/82 Lastnight 148/82 This AM 148/83 Before lunch today 140/78 Patient relates somewhat stressed with work Denies being depressed Healthy diet regular physical activity no excessive salt intake energy level overall pretty good   Review of Systems     Objective:   Physical Exam  General-in no acute distress Eyes-no discharge Lungs-respiratory rate normal, CTA CV-no murmurs,RRR Extremities skin warm dry no edema Neuro grossly normal Behavior normal, alert       Assessment & Plan:  Blood pressure checked multiple times All of the readings systolic was elevated  HTN Given that he has had some headache tightness over the past couple days I would recommend amlodipine 2.5 mg Medication discussed if any significant side effects notify us  Give us  feedback in 2 to 3 weeks how things are going Bring blood pressure cuff on next visit Continue healthy diet minimize salt stay physically active Minimize caffeine Goal is to get systolic 130 or less diastolic 80 or less recheck in 4 weeks Will order lab work

## 2024-05-27 ENCOUNTER — Encounter: Payer: Self-pay | Admitting: Family Medicine

## 2024-05-30 ENCOUNTER — Ambulatory Visit: Payer: Self-pay | Admitting: Family Medicine

## 2024-05-30 LAB — MICROALBUMIN / CREATININE URINE RATIO
Creatinine, Urine: 125 mg/dL
Microalb/Creat Ratio: 5 mg/g{creat} (ref 0–29)
Microalbumin, Urine: 5.7 ug/mL

## 2024-05-30 LAB — CMP14+EGFR
ALT: 19 IU/L (ref 0–44)
AST: 21 IU/L (ref 0–40)
Albumin: 4.6 g/dL (ref 3.8–4.9)
Alkaline Phosphatase: 95 IU/L (ref 47–123)
BUN/Creatinine Ratio: 16 (ref 9–20)
BUN: 18 mg/dL (ref 6–24)
Bilirubin Total: 1 mg/dL (ref 0.0–1.2)
CO2: 24 mmol/L (ref 20–29)
Calcium: 9.5 mg/dL (ref 8.7–10.2)
Chloride: 99 mmol/L (ref 96–106)
Creatinine, Ser: 1.14 mg/dL (ref 0.76–1.27)
Globulin, Total: 2.4 g/dL (ref 1.5–4.5)
Glucose: 91 mg/dL (ref 70–99)
Potassium: 5.2 mmol/L (ref 3.5–5.2)
Sodium: 138 mmol/L (ref 134–144)
Total Protein: 7 g/dL (ref 6.0–8.5)
eGFR: 74 mL/min/1.73 (ref >59)

## 2024-05-30 LAB — LIPID PANEL
Chol/HDL Ratio: 3.1 ratio (ref 0.0–5.0)
Cholesterol, Total: 145 mg/dL (ref 100–199)
HDL: 47 mg/dL (ref >39)
LDL Chol Calc (NIH): 82 mg/dL (ref 0–99)
Triglycerides: 84 mg/dL (ref 0–149)
VLDL Cholesterol Cal: 16 mg/dL (ref 5–40)

## 2024-05-30 LAB — PSA: Prostate Specific Ag, Serum: 0.7 ng/mL (ref 0.0–4.0)

## 2024-06-09 ENCOUNTER — Encounter: Payer: Self-pay | Admitting: Family Medicine

## 2024-06-09 ENCOUNTER — Ambulatory Visit (INDEPENDENT_AMBULATORY_CARE_PROVIDER_SITE_OTHER): Admitting: Family Medicine

## 2024-06-09 VITALS — BP 122/72 | HR 44 | Temp 97.9°F | Ht 70.5 in | Wt 180.0 lb

## 2024-06-09 DIAGNOSIS — I10 Essential (primary) hypertension: Secondary | ICD-10-CM | POA: Diagnosis not present

## 2024-06-09 NOTE — Progress Notes (Signed)
   Subjective:    Patient ID: Joe Proctor, male    DOB: [birth year], 58 y.o.   MRN: [id number]  HPI HTN follow up    Review of Systems     Objective:   Physical Exam        Assessment & Plan:

## 2024-06-09 NOTE — Progress Notes (Signed)
   Acute Office Visit  Subjective:     Patient ID: Joe Proctor, male    DOB: Jan 06, 1966, 58 y.o.   MRN: [id number]   HPI [name] is being seen today for a follow up regarding his blood pressure. Has been taking amlodipine  as prescribed. Denies side effects. Has a home blood pressure monitor, and says that most of the time blood pressure ranges 130's/80's. Today he did have a reading of 158/83 this morning when he woke up before taking his medication. Endorses that he has a lot of job stress, and that is probably what made it increase. Says that his headaches have gotten a lot better since being on the medication. Denies blurred vision. Eats a healthy diet, and tries to minimize sodium intake. Runs frequently for exercise.   Patient was initially seen by Damien Pringle, FNP we discussed his case.  He is tolerating the medicine well consistent with it staying physically active as well blood pressures for the most part are doing very well  Review of Systems  Constitutional:  Negative for malaise/fatigue.  Eyes:  Negative for blurred vision.  Respiratory:  Negative for cough, shortness of breath and wheezing.   Cardiovascular:  Negative for chest pain, palpitations and leg swelling.  Neurological:  Negative for dizziness and headaches.      Objective:    Today's Vitals   06/09/24 1356  BP: 122/72  Pulse: (!) 44  Temp: 97.9 F (36.6 C)  SpO2: 99%  Weight: 180 lb (81.6 kg)  Height: 5' 10.5 (1.791 m)   Body mass index is 25.46 kg/m.   Physical Exam Vitals and nursing note reviewed.  Constitutional:      General: He is not in acute distress.    Appearance: Normal appearance. He is normal weight. He is not ill-appearing.  Cardiovascular:     Rate and Rhythm: Normal rate and regular rhythm.     Heart sounds: Normal heart sounds, S1 normal and S2 normal. No murmur heard. Pulmonary:     Effort: Pulmonary effort is normal. No respiratory distress.     Breath sounds: Normal breath  sounds. No wheezing.  Musculoskeletal:     Right lower leg: No edema.     Left lower leg: No edema.  Neurological:     Mental Status: He is alert.  Psychiatric:        Mood and Affect: Mood normal.        Behavior: Behavior normal.        Thought Content: Thought content normal.        Judgment: Judgment normal.    No results found for any visits on 06/09/24.    Assessment & Plan:  1. Primary hypertension (Primary) -Discussed with patient that I would like to keep medication at same dose, due to risk of dropping blood pressure too low. Due to majority of blood pressures being 130's systolic at home, I think his blood pressure is being managed appropriately. Patient agrees.  -Discussed the importance of decreasing stress levels with job. Patient to continue limiting processed foods or foods with high sodium content. Will continue with exercise regimen.  -Advised patient to take blood pressure once per week, and follow up on MyChart with results in 8-12 weeks.   Return in about 6 months (around 12/07/2024).   Damien KATHEE Pringle, FNP Agree with management Continue current dosing Hold off on increasing dose currently

## 2024-06-15 ENCOUNTER — Ambulatory Visit: Admitting: Family Medicine

## 2024-12-08 ENCOUNTER — Ambulatory Visit: Admitting: Family Medicine
